# Patient Record
Sex: Female | Born: 1970 | Race: White | Hispanic: No | Marital: Married | Smoking: Never smoker
Health system: Southern US, Community
[De-identification: ages and names within clinical notes are randomized; demographics above are authoritative.]

## PROBLEM LIST (undated history)

## (undated) DIAGNOSIS — D352 Benign neoplasm of pituitary gland: Secondary | ICD-10-CM

## (undated) DIAGNOSIS — T7840XA Allergy, unspecified, initial encounter: Secondary | ICD-10-CM

## (undated) DIAGNOSIS — E349 Endocrine disorder, unspecified: Secondary | ICD-10-CM

## (undated) DIAGNOSIS — N946 Dysmenorrhea, unspecified: Secondary | ICD-10-CM

## (undated) DIAGNOSIS — N809 Endometriosis, unspecified: Secondary | ICD-10-CM

## (undated) DIAGNOSIS — N912 Amenorrhea, unspecified: Secondary | ICD-10-CM

## (undated) DIAGNOSIS — I639 Cerebral infarction, unspecified: Secondary | ICD-10-CM

## (undated) HISTORY — DX: Endometriosis, unspecified: N80.9

## (undated) HISTORY — DX: Cerebral infarction, unspecified: I63.9

## (undated) HISTORY — DX: Benign neoplasm of pituitary gland: D35.2

## (undated) HISTORY — DX: Allergy, unspecified, initial encounter: T78.40XA

## (undated) HISTORY — PX: ABDOMINAL HYSTERECTOMY: SHX81

## (undated) HISTORY — DX: Endocrine disorder, unspecified: E34.9

## (undated) HISTORY — DX: Dysmenorrhea, unspecified: N94.6

## (undated) HISTORY — PX: ENDOMETRIAL ABLATION: SHX621

## (undated) HISTORY — DX: Amenorrhea, unspecified: N91.2

## (undated) HISTORY — PX: COLPOSCOPY: SHX161

## (undated) HISTORY — PX: PELVIC LAPAROSCOPY: SHX162

---

## 2009-03-03 HISTORY — PX: TONSILLECTOMY AND ADENOIDECTOMY: SUR1326

## 2009-12-01 DIAGNOSIS — I639 Cerebral infarction, unspecified: Secondary | ICD-10-CM

## 2009-12-01 HISTORY — DX: Cerebral infarction, unspecified: I63.9

## 2010-11-02 HISTORY — PX: KNEE ARTHROSCOPY W/ ACL RECONSTRUCTION: SHX1858

## 2016-12-04 DIAGNOSIS — J45998 Other asthma: Secondary | ICD-10-CM | POA: Diagnosis not present

## 2016-12-04 DIAGNOSIS — Z8673 Personal history of transient ischemic attack (TIA), and cerebral infarction without residual deficits: Secondary | ICD-10-CM | POA: Diagnosis not present

## 2016-12-04 DIAGNOSIS — E236 Other disorders of pituitary gland: Secondary | ICD-10-CM | POA: Diagnosis not present

## 2016-12-04 DIAGNOSIS — J309 Allergic rhinitis, unspecified: Secondary | ICD-10-CM | POA: Diagnosis not present

## 2016-12-08 ENCOUNTER — Other Ambulatory Visit: Payer: Self-pay | Admitting: Family Medicine

## 2016-12-08 DIAGNOSIS — E236 Other disorders of pituitary gland: Secondary | ICD-10-CM

## 2016-12-23 ENCOUNTER — Ambulatory Visit
Admission: RE | Admit: 2016-12-23 | Discharge: 2016-12-23 | Disposition: A | Discharge status: Home / Self Care | Payer: 59 | Source: Ambulatory Visit | Attending: Family Medicine | Admitting: Family Medicine

## 2016-12-23 DIAGNOSIS — E236 Other disorders of pituitary gland: Secondary | ICD-10-CM | POA: Diagnosis not present

## 2017-01-02 DIAGNOSIS — N643 Galactorrhea not associated with childbirth: Secondary | ICD-10-CM | POA: Diagnosis not present

## 2017-01-02 DIAGNOSIS — H47099 Other disorders of optic nerve, not elsewhere classified, unspecified eye: Secondary | ICD-10-CM | POA: Diagnosis not present

## 2017-01-02 DIAGNOSIS — E221 Hyperprolactinemia: Secondary | ICD-10-CM | POA: Diagnosis not present

## 2017-01-02 DIAGNOSIS — Z8673 Personal history of transient ischemic attack (TIA), and cerebral infarction without residual deficits: Secondary | ICD-10-CM | POA: Diagnosis not present

## 2017-01-15 DIAGNOSIS — E236 Other disorders of pituitary gland: Secondary | ICD-10-CM | POA: Diagnosis not present

## 2017-03-19 MED FILL — CABERGOLINE 0.5 MG TABLET: 0.5 | 28 days supply | Qty: 4 | Fill #0

## 2017-04-14 MED FILL — CABERGOLINE 0.5 MG TABS: 0.5 | 28 days supply | Qty: 4 | Fill #1

## 2017-05-13 MED FILL — CABERGOLINE 0.5 MG TABS: 0.5 | 28 days supply | Qty: 4 | Fill #2

## 2017-06-30 MED FILL — CABERGOLINE 0.5 MG TABS: 0.5 | 28 days supply | Qty: 4 | Fill #3

## 2017-07-08 ENCOUNTER — Other Ambulatory Visit: Payer: Self-pay

## 2017-07-08 ENCOUNTER — Ambulatory Visit (INDEPENDENT_AMBULATORY_CARE_PROVIDER_SITE_OTHER): Payer: No Typology Code available for payment source

## 2017-07-08 ENCOUNTER — Ambulatory Visit (INDEPENDENT_AMBULATORY_CARE_PROVIDER_SITE_OTHER): Payer: No Typology Code available for payment source | Admitting: Family Medicine

## 2017-07-08 ENCOUNTER — Encounter: Payer: Self-pay | Admitting: Family Medicine

## 2017-07-08 VITALS — BP 108/72 | HR 88 | Temp 98.5°F | Ht 61.5 in | Wt 146.2 lb

## 2017-07-08 DIAGNOSIS — M25542 Pain in joints of left hand: Secondary | ICD-10-CM | POA: Diagnosis not present

## 2017-07-08 DIAGNOSIS — Z13220 Encounter for screening for lipoid disorders: Secondary | ICD-10-CM | POA: Diagnosis not present

## 2017-07-08 DIAGNOSIS — D497 Neoplasm of unspecified behavior of endocrine glands and other parts of nervous system: Secondary | ICD-10-CM | POA: Insufficient documentation

## 2017-07-08 DIAGNOSIS — K130 Diseases of lips: Secondary | ICD-10-CM | POA: Diagnosis not present

## 2017-07-08 DIAGNOSIS — R253 Fasciculation: Secondary | ICD-10-CM

## 2017-07-08 DIAGNOSIS — Z8673 Personal history of transient ischemic attack (TIA), and cerebral infarction without residual deficits: Secondary | ICD-10-CM | POA: Insufficient documentation

## 2017-07-08 MED ORDER — DICLOFENAC SODIUM 1 % TD GEL: 2.0000 g | Freq: Four times a day (QID) | TRANSDERMAL | 3 refills | Status: DC

## 2017-07-08 NOTE — Progress Notes (Signed)
Patient: Morgan Wolf MRN: [id number] DOB: Jun 03, 1970 PCP: Orma Flaming, MD     Subjective:  Chief Complaint  Patient presents with  . New Patient (Initial Visit)    hand pain    HPI: The patient is a 47 y.o. female who presents today for left hand issues. She had issues that started years ago. She saw a rheumatologist a few years and work up was negative. It has bothered her ever since, but has started to really flair up this past month or two. Pain is in her left thumb joint. She states if she is doing something that is stressing it, it will "lock up" on her. Pain is constant, but worse when she is using it. Pain is on the muscle and extends into wrist joint. She has not taken any medication otc to see if this alleviates it. Pain rated as a 7-8/10 and described as stabbing in nature. Otherwise it is a dull ache. No trauma to her hand in the past. She is right handed. 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 She does feel like it is weaker than the other hand, but it is her non dominant hand.   She also has a fasciculation in her middle finger that makes her middle finger twitch and you can see the fasciculation up into her wrist. It will last for 2-3 minutes. This started a few weeks ago. This is also in the left hand. She has done nothing out of the ordinary. Denies dropping anything or numbness/tingling.   Chapped lips: she has had excessively chapped lips x 2-3 months. She drinks a ton of water and keeps her lips slathered with chap stick. They are cracking and painful. No dry eyes, no dry mouth and feels like makes good saliva. She denies any dry hands/dry skin. She states she just had her thyroid checked with her endocrinologist.  She did start cabergoline in January, but denies any benadryl/anti histamine use. No other over the counter medication tried.   Review of Systems  Constitutional: Negative for fatigue and fever.  HENT:       Chapped lips   Eyes: Negative for pain.       No  dry eyes  Respiratory: Negative for cough and shortness of breath.   Cardiovascular: Negative for chest pain, palpitations and leg swelling.  Gastrointestinal: Negative for abdominal pain.  Endocrine: Negative for cold intolerance and heat intolerance.  Musculoskeletal: Positive for arthralgias and myalgias. Negative for back pain, gait problem, joint swelling, neck pain and neck stiffness.  Skin: Negative for color change.  Neurological: Negative for tremors, weakness and numbness.       Fasciculations of left middle finger    Allergies Patient is allergic to codeine and sulfa antibiotics.  Past Medical History Patient  has a past medical history of Allergy and Stroke ([place]).  Surgical History Patient  has a past surgical history that includes Tonsillectomy and adenoidectomy (2011); Knee arthroscopy w/ ACL reconstruction (11/2010); and Cesarean section.  Family History Pateint's family history includes Arthritis in her mother; Cancer in her maternal grandfather and paternal grandmother; Diabetes in her paternal grandmother; Hyperlipidemia in her father; Hypertension in her mother.  Social History Patient  reports that she has never smoked. She has never used smokeless tobacco. She reports that she does not use drugs.    Objective: Vitals:   07/08/17 1414  BP: 108/72  Pulse: 88  Temp: 98.5 F (36.9 C)  SpO2: 98%  Weight: 146 lb 3.2 oz (66.3  kg)  Height: 5' 1.5" (1.562 m)    Body mass index is 27.18 kg/m.  Physical Exam  Constitutional: She is oriented to person, place, and time. She appears well-developed and well-nourished.  HENT:  Lips chapped. Not cracked. Normal oral mucosa.   Eyes: Pupils are equal, round, and reactive to light. Conjunctivae and EOM are normal.  Neck: Normal range of motion. Neck supple. No thyromegaly present.  Cardiovascular: Normal rate, regular rhythm, normal heart sounds and intact distal pulses.  No murmur heard. Pulmonary/Chest: Effort  normal and breath sounds normal.  Abdominal: Soft. Bowel sounds are normal. She exhibits no distension. There is no tenderness.  Musculoskeletal: Normal range of motion. She exhibits tenderness. She exhibits no edema.  TTP over left thumb MCP joint. Negative Finkelstein.   Lymphadenopathy:    She has no cervical adenopathy.  Neurological: She is alert and oriented to person, place, and time. She displays normal reflexes. No cranial nerve deficit. Coordination normal.  No muscle wasting in thenar eminence. hand grip intact and strong 5/5. Muscle strength 5/5 in upper extremities.  Negative phalen and tinel sign.   Skin: Skin is warm and dry. No rash noted.  Psychiatric: She has a normal mood and affect. Her behavior is normal.  Vitals reviewed.  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 Official read pending.     Assessment/plan:  1. Arthralgia of left hand Likely secondary to osteoarthritis.  Check labs to rule out any autoimmune disorder reassured her likely osteoarthritis.  We will start her on Voltaren gel up to 4 times a day as needed.  Would prefer she use gel over oral NSAID due to being on high-dose daily aspirin daily would like to reduce risk of ulcer. She is also going to start tumeric.  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  She has no desire to see orthopedics at this time for possible injections and would prefer to continue conservative treatment at this time.  She will let me know if pain becomes worse or unbearable and then we will send to Ortho at that time. - CBC with Differential/Platelet - Comprehensive metabolic panel - ANA - Rheumatoid factor - XR Hand Complete Left - DG Hand Complete Left; Future  2. Muscle twitch Exam reassuring.  She has no muscle atrophy, no muscle weakness, and no loss of handgrip strength.  She has no radicular signs including no numbness or  tingling. This has only been going on for a few weeks so we will check labs and monitor.  She is to let me know if symptoms worsen or she starts to have numbness tingling or loss of strength so we can initiate a neuro referral. - CK  3. Dry lips Likely secondary to new medicine, cabergoline, for her pituitary tumor, but we will check labs to rule out autoimmune disorders such as Sjorgen's syndrome.  Advised Vaseline or Cetaphil moisturizing cream multiple times throughout the day.  She already drinks a significant amount of water. Cool mist humidifier at night.  Thyroid level just checked at her endocrinologist and she does not want to repeat this again. We are requesting records for this and OV notes.   4. Lipid screening Due for recheck. On ASA. No longer on statin due to unknown nature of stroke and thought not secondary to plaque.  - Lipid panel    Return if symptoms worsen  or fail to improve.     Orma Flaming, MD [place]  07/08/2017

## 2017-07-09 LAB — COMPREHENSIVE METABOLIC PANEL
ALT: 10 U/L (ref 0–35)
AST: 14 U/L (ref 0–37)
Albumin: 4.2 g/dL (ref 3.5–5.2)
Alkaline Phosphatase: 54 U/L (ref 39–117)
BUN: 11 mg/dL (ref 6–23)
CO2: 28 mEq/L (ref 19–32)
Calcium: 9.7 mg/dL (ref 8.4–10.5)
Chloride: 103 mEq/L (ref 96–112)
Creatinine, Ser: 0.82 mg/dL (ref 0.40–1.20)
GFR: 80.26 mL/min (ref >60.00)
Glucose, Bld: 90 mg/dL (ref 70–99)
Potassium: 4 mEq/L (ref 3.5–5.1)
Sodium: 139 mEq/L (ref 135–145)
Total Bilirubin: 0.4 mg/dL (ref 0.2–1.2)
Total Protein: 6.7 g/dL (ref 6.0–8.3)

## 2017-07-09 LAB — CBC WITH DIFFERENTIAL/PLATELET
Basophils Absolute: 0 10*3/uL (ref 0.0–0.1)
Basophils Relative: 0.6 % (ref 0.0–3.0)
Eosinophils Absolute: 0.1 10*3/uL (ref 0.0–0.7)
Eosinophils Relative: 1.6 % (ref 0.0–5.0)
HCT: 43 % (ref 36.0–46.0)
Hemoglobin: 14.2 g/dL (ref 12.0–15.0)
Lymphocytes Relative: 27.1 % (ref 12.0–46.0)
Lymphs Abs: 1.7 10*3/uL (ref 0.7–4.0)
MCHC: 32.9 g/dL (ref 30.0–36.0)
MCV: 88.3 fl (ref 78.0–100.0)
Monocytes Absolute: 0.3 10*3/uL (ref 0.1–1.0)
Monocytes Relative: 5.5 % (ref 3.0–12.0)
Neutro Abs: 4.1 10*3/uL (ref 1.4–7.7)
Neutrophils Relative %: 65.2 % (ref 43.0–77.0)
Platelets: 260 10*3/uL (ref 150.0–400.0)
RBC: 4.87 Mil/uL (ref 3.87–5.11)
RDW: 13.1 % (ref 11.5–15.5)
WBC: 6.4 10*3/uL (ref 4.0–10.5)

## 2017-07-09 LAB — LIPID PANEL
Cholesterol: 212 mg/dL — ABNORMAL HIGH (ref 0–200)
HDL: 59.2 mg/dL (ref >39.00)
LDL Cholesterol: 132 mg/dL — ABNORMAL HIGH (ref 0–99)
NonHDL: 152.91
Total CHOL/HDL Ratio: 4
Triglycerides: 104 mg/dL (ref 0.0–149.0)
VLDL: 20.8 mg/dL (ref 0.0–40.0)

## 2017-07-09 LAB — CK: Total CK: 47 U/L (ref 7–177)

## 2017-07-10 LAB — RHEUMATOID FACTOR: Rhuematoid fact SerPl-aCnc: <14 IU/mL (ref <14)

## 2017-07-10 LAB — ANA: Anti Nuclear Antibody(ANA): NEGATIVE

## 2017-07-21 ENCOUNTER — Other Ambulatory Visit (HOSPITAL_COMMUNITY)
Admission: RE | Admit: 2017-07-21 | Discharge: 2017-07-21 | Disposition: A | Discharge status: Home / Self Care | Payer: No Typology Code available for payment source | Source: Ambulatory Visit | Attending: Obstetrics and Gynecology | Admitting: Obstetrics and Gynecology

## 2017-07-21 ENCOUNTER — Ambulatory Visit (INDEPENDENT_AMBULATORY_CARE_PROVIDER_SITE_OTHER): Payer: No Typology Code available for payment source | Admitting: Obstetrics and Gynecology

## 2017-07-21 ENCOUNTER — Other Ambulatory Visit: Payer: Self-pay

## 2017-07-21 ENCOUNTER — Encounter: Payer: Self-pay | Admitting: Obstetrics and Gynecology

## 2017-07-21 VITALS — BP 102/60 | HR 80 | Resp 14 | Ht 61.25 in | Wt 146.0 lb

## 2017-07-21 DIAGNOSIS — Z01419 Encounter for gynecological examination (general) (routine) without abnormal findings: Secondary | ICD-10-CM

## 2017-07-21 DIAGNOSIS — N946 Dysmenorrhea, unspecified: Secondary | ICD-10-CM

## 2017-07-21 DIAGNOSIS — D352 Benign neoplasm of pituitary gland: Secondary | ICD-10-CM | POA: Diagnosis not present

## 2017-07-21 DIAGNOSIS — N914 Secondary oligomenorrhea: Secondary | ICD-10-CM | POA: Diagnosis not present

## 2017-07-21 DIAGNOSIS — Z124 Encounter for screening for malignant neoplasm of cervix: Secondary | ICD-10-CM | POA: Diagnosis present

## 2017-07-21 DIAGNOSIS — Z9889 Other specified postprocedural states: Secondary | ICD-10-CM | POA: Diagnosis not present

## 2017-07-21 NOTE — Patient Instructions (Signed)
EXERCISE AND DIET:  We recommended that you start or continue a regular exercise program for good health. Regular exercise means any activity that makes your heart beat faster and makes you sweat.  We recommend exercising at least 30 minutes per day at least 3 days a week, preferably 4 or 5.  We also recommend a diet low in fat and sugar.  Inactivity, poor dietary choices and obesity can cause diabetes, heart attack, stroke, and kidney damage, among others.    ALCOHOL AND SMOKING:  Women should limit their alcohol intake to no more than 7 drinks/beers/glasses of wine (combined, not each!) per week. Moderation of alcohol intake to this level decreases your risk of breast cancer and liver damage. And of course, no recreational drugs are part of a healthy lifestyle.  And absolutely no smoking or even second hand smoke. Most people know smoking can cause heart and lung diseases, but did you know it also contributes to weakening of your bones? Aging of your skin?  Yellowing of your teeth and nails?  CALCIUM AND VITAMIN D:  Adequate intake of calcium and Vitamin D are recommended.  The recommendations for exact amounts of these supplements seem to change often, but generally speaking 600 mg of calcium (either carbonate or citrate) and 800 units of Vitamin D per day seems prudent. Certain women may benefit from higher intake of Vitamin D.  If you are among these women, your doctor will have told you during your visit.    PAP SMEARS:  Pap smears, to check for cervical cancer or precancers,  have traditionally been done yearly, although recent scientific advances have shown that most women can have pap smears less often.  However, every woman still should have a physical exam from her gynecologist every year. It will include a breast check, inspection of the vulva and vagina to check for abnormal growths or skin changes, a visual exam of the cervix, and then an exam to evaluate the size and shape of the uterus and  ovaries.  And after 45 years of age, a rectal exam is indicated to check for rectal cancers. We will also provide age appropriate advice regarding health maintenance, like when you should have certain vaccines, screening for sexually transmitted diseases, bone density testing, colonoscopy, mammograms, etc.   MAMMOGRAMS:  All women over 40 years old should have a yearly mammogram. Many facilities now offer a "3D" mammogram, which may cost around $50 extra out of pocket. If possible,  we recommend you accept the option to have the 3D mammogram performed.  It both reduces the number of women who will be called back for extra views which then turn out to be normal, and it is better than the routine mammogram at detecting truly abnormal areas.    COLONOSCOPY:  Colonoscopy to screen for colon cancer is recommended for all women at age 50.  We know, you hate the idea of the prep.  We agree, BUT, having colon cancer and not knowing it is worse!!  Colon cancer so often starts as a polyp that can be seen and removed at colonscopy, which can quite literally save your life!  And if your first colonoscopy is normal and you have no family history of colon cancer, most women don't have to have it again for 10 years.  Once every ten years, you can do something that may end up saving your life, right?  We will be happy to help you get it scheduled when you are ready.    Be sure to check your insurance coverage so you understand how much it will cost.  It may be covered as a preventative service at no cost, but you should check your particular policy.      Breast Self-Awareness Breast self-awareness means being familiar with how your breasts look and feel. It involves checking your breasts regularly and reporting any changes to your health care provider. Practicing breast self-awareness is important. A change in your breasts can be a sign of a serious medical problem. Being familiar with how your breasts look and feel allows  you to find any problems early, when treatment is more likely to be successful. All women should practice breast self-awareness, including women who have had breast implants. How to do a breast self-exam One way to learn what is normal for your breasts and whether your breasts are changing is to do a breast self-exam. To do a breast self-exam: Look for Changes  1. Remove all the clothing above your waist. 2. Stand in front of a mirror in a room with good lighting. 3. Put your hands on your hips. 4. Push your hands firmly downward. 5. Compare your breasts in the mirror. Look for differences between them (asymmetry), such as: ? Differences in shape. ? Differences in size. ? Puckers, dips, and bumps in one breast and not the other. 6. Look at each breast for changes in your skin, such as: ? Redness. ? Scaly areas. 7. Look for changes in your nipples, such as: ? Discharge. ? Bleeding. ? Dimpling. ? Redness. ? A change in position. Feel for Changes  Carefully feel your breasts for lumps and changes. It is best to do this while lying on your back on the floor and again while sitting or standing in the shower or tub with soapy water on your skin. Feel each breast in the following way:  Place the arm on the side of the breast you are examining above your head.  Feel your breast with the other hand.  Start in the nipple area and make  inch (2 cm) overlapping circles to feel your breast. Use the pads of your three middle fingers to do this. Apply light pressure, then medium pressure, then firm pressure. The light pressure will allow you to feel the tissue closest to the skin. The medium pressure will allow you to feel the tissue that is a little deeper. The firm pressure will allow you to feel the tissue close to the ribs.  Continue the overlapping circles, moving downward over the breast until you feel your ribs below your breast.  Move one finger-width toward the center of the body.  Continue to use the  inch (2 cm) overlapping circles to feel your breast as you move slowly up toward your collarbone.  Continue the up and down exam using all three pressures until you reach your armpit.  Write Down What You Find  Write down what is normal for each breast and any changes that you find. Keep a written record with breast changes or normal findings for each breast. By writing this information down, you do not need to depend only on memory for size, tenderness, or location. Write down where you are in your menstrual cycle, if you are still menstruating. If you are having trouble noticing differences in your breasts, do not get discouraged. With time you will become more familiar with the variations in your breasts and more comfortable with the exam. How often should I examine my breasts? Examine  your breasts every month. If you are breastfeeding, the best time to examine your breasts is after a feeding or after using a breast pump. If you menstruate, the best time to examine your breasts is 5-7 days after your period is over. During your period, your breasts are lumpier, and it may be more difficult to notice changes. When should I see my health care provider? See your health care provider if you notice:  A change in shape or size of your breasts or nipples.  A change in the skin of your breast or nipples, such as a reddened or scaly area.  Unusual discharge from your nipples.  A lump or thick area that was not there before.  Pain in your breasts.  Anything that concerns you.  This information is not intended to replace advice given to you by your health care provider. Make sure you discuss any questions you have with your health care provider. Document Released: 02/17/2005 Document Revised: 07/26/2015 Document Reviewed: 01/07/2015 Elsevier Interactive Patient Education  [employer].

## 2017-07-21 NOTE — Progress Notes (Signed)
47 y.o. [id number] MarriedCaucasianF here for annual exam.   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 Diagnosed in 2011 when she had a stroke. No residual issues from the stroke. Se was on OCP's at the time, was ill. Not clear why she has had a stroke.  She had an endometrial ablation in 2012, cycles were monthly and very heavy. She having very irregular bleeding since the ablation. Only bleeds or spots for 1-3 every 4-6 months. She has had severe menstrual like cramping every 3-6 month, often doesn't bleed with the cramps. Can last 1-2 days, takes Aleve which helps.  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 Some vaginal dryness.  Period Duration (Days): 1-2 days  Period Pattern: (!) Irregular Menstrual Flow: Light, Moderate Menstrual Control: Thin pad Menstrual Control Change Freq (Hours): changes pad 2-3 times a day  Dysmenorrhea: (!) Severe Dysmenorrhea Symptoms: Cramping  Sexually active, no pain.   Patient's last menstrual period was 05/26/2017.          Sexually active: Yes.    The current method of family planning is vasectomy.    Exercising: Yes.    aerobic/ dance/ weights/ walk  Smoker:  no  Health Maintenance: Pap:  02/2016 WNL per patient  History of abnormal Pap:  Yes - years ago- colposcopy neg MMG:  02/2016 WNL per patient  Colonoscopy: 2002 normal per patient  BMD:   02/2016 normal per patient  TDaP:  2011 Gardasil: no, declines.     reports that she has never smoked. She has never used smokeless tobacco. She reports that she drinks about 1.2 - 1.8 oz of alcohol per week. She reports that she does not use drugs. 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 Husband works for IT at [employer].  Past Medical History:  Diagnosis Date  . Allergy   . Amenorrhea   . Dysmenorrhea   . Endometriosis   . Hormone disorder   . Prolactinoma ([place])    Followed by Dr Chalmers Cater  . Stroke [facility])     Past Surgical History:  Procedure Laterality Date   . CESAREAN SECTION    . COLPOSCOPY    . ENDOMETRIAL ABLATION    . KNEE ARTHROSCOPY W/ ACL RECONSTRUCTION  11/2010  . PELVIC LAPAROSCOPY     Endometriosis  . TONSILLECTOMY AND ADENOIDECTOMY  2011    Current Outpatient Medications  Medication Sig Dispense Refill  . aspirin 325 MG tablet Take 325 mg by mouth daily.    . cabergoline (DOSTINEX) 0.5 MG tablet Take 0.25 mg by mouth once a week.    . diclofenac sodium (VOLTAREN) 1 % GEL Apply 2 g topically 4 (four) times daily. To lefy thumb joint (Patient not taking: Reported on 07/21/2017) 100 g 3   No current facility-administered medications for this visit.     Family History  Problem Relation Age of Onset  . Arthritis Mother   . Hypertension Mother   . Hyperlipidemia Father   . Cancer Maternal Grandfather   . Lung cancer Maternal Grandfather   . Cancer Paternal Grandmother   . Diabetes Paternal Grandmother   . Breast cancer Paternal Grandmother     Review of Systems  Constitutional: Negative.   HENT: Negative.   Eyes: Negative.   Respiratory: Negative.   Cardiovascular: Negative.   Gastrointestinal: Negative.   Endocrine: Negative.   Genitourinary: Positive for menstrual problem.       Dysmenorrhea  Irregular menstrual cycles  Musculoskeletal: Negative.   Skin: Negative.   Allergic/Immunologic: Negative.   Neurological: Negative.   Psychiatric/Behavioral: Negative.     Exam:   BP 102/60 (BP Location: Right Arm, Patient Position: Sitting, Cuff Size: Normal)   Pulse 80   Resp 14   Ht 5' 1.25" (1.556 m)   Wt 146 lb (66.2 kg)   LMP 05/26/2017   BMI 27.36 kg/m   Weight change: @WEIGHTCHANGE @ Height:   Height: 5' 1.25" (155.6 cm)  Ht Readings from Last 3 Encounters:  07/21/17 5' 1.25" (1.556 m)  07/08/17 5' 1.5" (1.562 m)    General appearance: alert, cooperative and appears stated age Head: Normocephalic, without obvious abnormality, atraumatic Neck: no adenopathy, supple, symmetrical, trachea midline and  thyroid normal to inspection and palpation Lungs: clear to auscultation bilaterally Cardiovascular: regular rate and rhythm Breasts: normal appearance, no masses or tenderness Abdomen: soft, non-tender; non distended,  no masses,  no organomegaly Extremities: extremities normal, atraumatic, no cyanosis or edema Skin: Skin color, texture, turgor normal. No rashes or lesions Lymph nodes: Cervical, supraclavicular, and axillary nodes normal. No abnormal inguinal nodes palpated Neurologic: Grossly normal   Pelvic: External genitalia:  no lesions              Urethra:  normal appearing urethra with no masses, tenderness or lesions              Bartholins and Skenes: normal                 Vagina: mildly atrophic appearing vagina with normal color and discharge, no lesions              Cervix: no lesions               Bimanual Exam:  Uterus:  normal size, contour, position, consistency, mobility, non-tender and anteverted              Adnexa: no mass, fullness, tenderness               Rectovaginal: Confirms               Anus:  normal sphincter tone, no lesions  Chaperone was present for exam.  A:  Well Woman with normal exam  H/O endometrial ablation  Oligomenorrhea, prolactin levels and TSH with Dr Chalmers Cater  Severe dysmenorrhea  P:   Pap with hpv  Mammogram # given  Labs UTD with her primary  Get a copy of her labs from Dr Chalmers Cater  Return for an ultrasound (discussed sonohysterogram and endometrial biopsy, she had a hard time with prior biopsy)  If she needs further evaluation will discuss options.

## 2017-07-22 LAB — ESTRADIOL: Estradiol: 113.5 pg/mL

## 2017-07-22 LAB — FOLLICLE STIMULATING HORMONE: FSH: 2.6 m[IU]/mL

## 2017-07-23 LAB — CYTOLOGY - PAP
Diagnosis: NEGATIVE
HPV: NOT DETECTED

## 2017-07-28 ENCOUNTER — Other Ambulatory Visit: Payer: No Typology Code available for payment source | Admitting: Obstetrics and Gynecology

## 2017-07-28 ENCOUNTER — Ambulatory Visit (INDEPENDENT_AMBULATORY_CARE_PROVIDER_SITE_OTHER): Payer: No Typology Code available for payment source | Admitting: Obstetrics and Gynecology

## 2017-07-28 ENCOUNTER — Encounter: Payer: Self-pay | Admitting: Obstetrics and Gynecology

## 2017-07-28 ENCOUNTER — Ambulatory Visit (INDEPENDENT_AMBULATORY_CARE_PROVIDER_SITE_OTHER): Payer: No Typology Code available for payment source

## 2017-07-28 VITALS — BP 110/60 | HR 70 | Ht 61.25 in | Wt 146.6 lb

## 2017-07-28 DIAGNOSIS — N939 Abnormal uterine and vaginal bleeding, unspecified: Secondary | ICD-10-CM

## 2017-07-28 DIAGNOSIS — N946 Dysmenorrhea, unspecified: Secondary | ICD-10-CM | POA: Diagnosis not present

## 2017-07-28 DIAGNOSIS — Z9889 Other specified postprocedural states: Secondary | ICD-10-CM

## 2017-07-28 DIAGNOSIS — R102 Pelvic and perineal pain: Secondary | ICD-10-CM | POA: Diagnosis not present

## 2017-07-28 DIAGNOSIS — N914 Secondary oligomenorrhea: Secondary | ICD-10-CM

## 2017-07-28 NOTE — Progress Notes (Signed)
GYNECOLOGY  VISIT   HPI: 47 y.o.   Married  Caucasian  female   G2P2002 with Patient's last menstrual period was 07/25/2017 (exact date).here for pelvic ultrasound. She has a h/o an endometrial ablation in 2012. She spots or bleeds for 1-3 days every 4-6 months and has intermittent severe menstrual like cramping every 3-6 months. The cramping and spotting don't typically come together.    GYNECOLOGIC HISTORY: Patient's last menstrual period was 07/25/2017 (exact date). Contraception:vasectomy Menopausal hormone therapy: none        OB History    Gravida  2   Para  2   Term  2   Preterm      AB      Living  2     SAB      TAB      Ectopic      Multiple      Live Births  2              Patient Active Problem List   Diagnosis Date Noted  . 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 History of CVA (cerebrovascular accident) 07/08/2017    Past Medical History:  Diagnosis Date  . Allergy   . Amenorrhea   . Dysmenorrhea   . Endometriosis   . Hormone disorder   . Prolactinoma ([place])    Followed by Dr Chalmers Cater  . Stroke [facility])     Past Surgical History:  Procedure Laterality Date  . CESAREAN SECTION    . COLPOSCOPY    . ENDOMETRIAL ABLATION    . KNEE ARTHROSCOPY W/ ACL RECONSTRUCTION  11/2010  . PELVIC LAPAROSCOPY     Endometriosis  . TONSILLECTOMY AND ADENOIDECTOMY  2011  C/S x 2. Laparoscopy was prior to C/S. No mention of significant adhesions  Current Outpatient Medications  Medication Sig Dispense Refill  . aspirin 325 MG tablet Take 325 mg by mouth daily.    . cabergoline (DOSTINEX) 0.5 MG tablet Take 0.25 mg by mouth once a week.    . diclofenac sodium (VOLTAREN) 1 % GEL Apply 2 g topically 4 (four) times daily. To lefy thumb joint 100 g 3   No current facility-administered medications for this visit.      ALLERGIES: Codeine and Sulfa antibiotics  Family History  Problem Relation Age of Onset  . Arthritis Mother   . Hypertension Mother   .  Hyperlipidemia Father   . Cancer Maternal Grandfather   . Lung cancer Maternal Grandfather   . Cancer Paternal Grandmother   . Diabetes Paternal Grandmother   . Breast cancer Paternal Grandmother     Social History   Socioeconomic History  . Marital status: Married    Spouse name: Katherina Wimer  . Number of children: 2  . Years of education: Not on file  . Highest education level: Not on file  Occupational History  . Not on file  Social Needs  . Financial resource strain: Not on file  . Food insecurity:    Worry: Not on file    Inability: Not on file  . Transportation needs:    Medical: Not on file    Non-medical: Not on file  Tobacco Use  . Smoking status: Never Smoker  . Smokeless tobacco: Never Used  Substance and Sexual Activity  . Alcohol use: Yes    Alcohol/week: 1.2 - 1.8 oz    Types: 2 - 3 Standard drinks or equivalent per week  . Drug use: Never  . Sexual activity: Yes  Partners: Male    Birth control/protection: Other-see comments    Comment: Vasectomy   Lifestyle  . Physical activity:    Days per week: Not on file    Minutes per session: Not on file  . Stress: Not on file  Relationships  . Social connections:    Talks on phone: Not on file    Gets together: Not on file    Attends religious service: Not on file    Active member of club or organization: Not on file    Attends meetings of clubs or organizations: Not on file    Relationship status: Not on file  . Intimate partner violence:    Fear of current or ex partner: Not on file    Emotionally abused: Not on file    Physically abused: Not on file    Forced sexual activity: Not on file  Other Topics Concern  . Not on file  Social History Narrative  . Not on file    Review of Systems  Constitutional: Negative.   HENT: Negative.   Eyes: Negative.   Respiratory: Negative.   Cardiovascular: Negative.   Gastrointestinal: Negative.   Genitourinary: Negative.   Musculoskeletal: Negative.    Skin: Negative.   Neurological: Negative.   Endo/Heme/Allergies: Negative.   Psychiatric/Behavioral: Negative.     PHYSICAL EXAMINATION:    BP 110/60   Pulse 70   Ht 5' 1.25" (1.556 m)   Wt 146 lb 9.6 oz (66.5 kg)   LMP 07/25/2017 (Exact Date)   BMI 27.47 kg/m     General appearance: alert, cooperative and appears stated age  Ultrasound images reviewed with the patient. U/S c/w adenomyosis, endometrium not clearly seen/distorted.   ASSESSMENT Abnormal uterine spotting and intermittent pelvic pain s/p endometrial ablation. She appears to have adenomyosis. Unable to adequately evaluate the lining of the uterus on ultrasound. Patient unable to tolerate endometrial biopsy in the past.  Not a candidate for OCP's secondary to a history of stroke.     PLAN Discussed options of doing nothing (small risk of missing a malignancy), hysteroscopy, D&C under ultrasound guidance, TLH/BS She is leaning toward TLH/BS We discussed the risks of surgery and the recovery. She will discuss it further with her husband.    An After Visit Summary was printed and given to the patient.  ~15 minutes face to face time of which over 50% was spent in counseling.

## 2017-07-29 ENCOUNTER — Telehealth: Payer: Self-pay | Admitting: Obstetrics and Gynecology

## 2017-07-29 NOTE — Telephone Encounter (Signed)
Call placed to patient to review benefits for potential surgery, as reviewed with Dr Talbert Nan. Left voicemail message requesting a return call

## 2017-07-29 NOTE — Telephone Encounter (Signed)
Patient returned call. Spoke with patient regarding benefit for potential surgery for a total laparoscopic hysterectomy and bilateral salpingectomy.. Patient understood and agreeable. Patient aware this is professional benefit only. Patient aware will be contacted by hospital for separate benefits. Patient is also aware of cancellation policy for surgery. Patient advises she is ready to proceed with scheduling and requested to advise Dr Talbert Nan.  Routing to Dr Talbert Nan  cc: Lamont Snowball, RN

## 2017-07-29 NOTE — Telephone Encounter (Signed)
Call to patient. Per DPR, can leave message on voice mail, which has number confirmation.  Left message regarding surgery date options and dates available in June and July. Left message to call back.

## 2017-07-30 NOTE — Telephone Encounter (Signed)
Patient returning call to [facility].

## 2017-07-30 NOTE — Telephone Encounter (Signed)
Call to patient. Surgery confirmed for 08-03-17 at [facility] at 0730. Surgery instruction sheet reviewed and printed copy will be provided to patient at consult appointment on 08-03-17.   Routing to provider for final review. Patient agreeable to disposition. Will close encounter.

## 2017-07-30 NOTE — Telephone Encounter (Signed)
Return call to patient. Desires to proceed with 08-10-17 surgery date. Advised will schedule and call her back once confirmed.

## 2017-08-03 ENCOUNTER — Telehealth: Payer: Self-pay | Admitting: Obstetrics and Gynecology

## 2017-08-03 ENCOUNTER — Ambulatory Visit (INDEPENDENT_AMBULATORY_CARE_PROVIDER_SITE_OTHER): Payer: No Typology Code available for payment source | Admitting: Obstetrics and Gynecology

## 2017-08-03 ENCOUNTER — Other Ambulatory Visit: Payer: Self-pay

## 2017-08-03 ENCOUNTER — Encounter: Payer: Self-pay | Admitting: Obstetrics and Gynecology

## 2017-08-03 VITALS — BP 124/70 | HR 84 | Resp 14

## 2017-08-03 DIAGNOSIS — Z9889 Other specified postprocedural states: Secondary | ICD-10-CM

## 2017-08-03 DIAGNOSIS — R102 Pelvic and perineal pain: Secondary | ICD-10-CM

## 2017-08-03 DIAGNOSIS — N939 Abnormal uterine and vaginal bleeding, unspecified: Secondary | ICD-10-CM

## 2017-08-03 DIAGNOSIS — Z8742 Personal history of other diseases of the female genital tract: Secondary | ICD-10-CM

## 2017-08-03 NOTE — Progress Notes (Addendum)
47 y.o. [id number] MarriedCaucasianF here for a pre-operative visit.  The patient has irregular bleeding and intermittent severe pelvic pain s/p endometrial ablation in 2012. Ultrasound c/w adenomyosis, endometrium not clearly seen/distorted. She has been unable to tolerate an office endometrial biopsy in the past, u/s is concerning for uterine scar tissue. She is not a candidate for OCP's given her h/o a stroke.   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 It was diagnosed in 2011 when she had a stroke. She was on OCP's at the time of the stroke and was ill with food poisoning and a URI (violently vomiting). Etiology of the stroke is not clear. No residual deficits. 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 Chronic infarcts on the left.   She reports her last prolactin level was in the 20's on medication    Patient's last menstrual period was 07/25/2017 (exact date).          Sexually active: Yes.    The current method of family planning is vasectomy.    Exercising: Yes.     Smoker:  no  Health Maintenance: Pap:  07/21/17 negative, negative hpv History of abnormal Pap:  Yes, years ago, negative colposcopy   reports that she has never smoked. She has never used smokeless tobacco. She reports that she drinks about 1.2 - 1.8 oz of alcohol per week. She reports that she does not use drugs. 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 Husband works for IT at [employer].   Past Medical History:  Diagnosis Date  . Allergy   . Amenorrhea   . Dysmenorrhea   . Endometriosis   . Hormone disorder   . Prolactinoma ([place])    Followed by Dr Chalmers Cater  . Stroke [facility])     Past Surgical History:  Procedure Laterality Date  . CESAREAN SECTION    . COLPOSCOPY    . ENDOMETRIAL ABLATION    . KNEE ARTHROSCOPY W/ ACL RECONSTRUCTION  11/2010  . PELVIC LAPAROSCOPY     Endometriosis  . TONSILLECTOMY AND ADENOIDECTOMY  2011  C/S x 2  Current Outpatient Medications   Medication Sig Dispense Refill  . aspirin 325 MG tablet Take 325 mg by mouth daily.    . cabergoline (DOSTINEX) 0.5 MG tablet Take 0.25 mg by mouth once a week. Monday    . Multiple Vitamins-Minerals (MULTIVITAMIN WITH MINERALS) tablet Take 1 tablet by mouth daily.    . Turmeric 500 MG TABS Take 1 capsule by mouth daily. With Glucosamine     No current facility-administered medications for this visit.     Family History  Problem Relation Age of Onset  . Arthritis Mother   . Hypertension Mother   . Hyperlipidemia Father   . Cancer Maternal Grandfather   . Lung cancer Maternal Grandfather   . Cancer Paternal Grandmother   . Diabetes Paternal Grandmother   . Breast cancer Paternal Grandmother     Review of Systems  Constitutional: Negative.   HENT: Negative.   Eyes: Negative.   Respiratory: Negative.   Cardiovascular: Negative.   Gastrointestinal: Negative.   Endocrine: Negative.   Genitourinary: Negative.   Musculoskeletal: Negative.   Skin: Negative.   Allergic/Immunologic: Negative.   Neurological: Negative.   Psychiatric/Behavioral: Negative.     Exam:   LMP 07/25/2017 (Exact Date)   Weight change: @WEIGHTCHANGE @ Height:      Ht Readings from Last 3 Encounters:  07/28/17 5' 1.25" (  1.556 m)  07/21/17 5' 1.25" (1.556 m)  07/08/17 5' 1.5" (1.562 m)    General appearance: alert, cooperative and appears stated age Head: Normocephalic, without obvious abnormality, atraumatic Heart: regular rate and rhythm Lungs: CTAB Abdomen: soft, non-tender; bowel sounds normal; no masses,  no organomegaly Extremities: normal, atraumatic, no cyanosis Skin: normal color, texture and turgor, no rashes or lesions Lymph: normal cervical supraclavicular and inguinal nodes Neurologic: grossly normal    A:  Pelvic pain  Irregular bleeding  H/O endometrial ablation, unable to adequately evaluate her cavity  H/O Stroke, not a candidate for OCP's  H/O macroadenoma controled on  Cabergoline  P:   Discussed options, including do nothing (aware of risk of missing cancer), hysteroscopy under ultrasound guidance, and laparoscopic hysterectomy, bilateral salpingectomy.  She desires definitive surgery  Discussed total laparoscopic hysterectomy, salpingectomy, treatment of endometriosis and cystoscopy. Reviewed the risks of the procedure, including infection, bleeding, damage to bowel/badder/vessels/ureters.  Discussed the possible need for laparotomy. Discussed post operative recovery and risk of cuff dehiscence. All of her questions were answered   Will stop her ASA today  Lovenox pre-op  Called patient's Neurosurgeon, Dr Kathyrn Sheriff and left a message for him to call to discuss her care  CC: Dr Alinda Sierras, Dr Chalmers Cater Dr Rogers Blocker

## 2017-08-03 NOTE — Telephone Encounter (Signed)
[place] scheduled for 08-10-17.  Routing to Dr Talbert Nan.

## 2017-08-03 NOTE — Patient Instructions (Addendum)
Morgan Wolf  08/03/2017      Your procedure is scheduled on  Monday 08/10/2017   Report to [name]  at   [place].M.   Call this number if you have problems the morning of surgery:[phone]   OUR ADDRESS IS [address], WE ARE LOCATED IN THE MEDICAL PLAZA WITH [facility].    Remember:  Do not eat food or drink liquids after midnight.   Take these medicines the morning of surgery with A SIP OF WATER:  none   Do not wear jewelry, make-up or nail polish.  Do not wear lotions, powders, or perfumes, or deoderant.  Do not shave 48 hours prior to surgery.  Men may shave face and neck.  Do not bring valuables to the hospital.  [name] is not responsible for any belongings or valuables.  Contacts, dentures or bridgework may not be worn into surgery.  Leave your suitcase in the car.  After surgery it may be brought to your room.  For patients admitted to the hospital, discharge time will be determined by your treatment team.  Patients discharged the day of surgery will not be allowed to drive home.   Special instructions:  Please read over the following fact sheets that you were given:  Please bring your medications in original bottles with you to the hospital the morning of surgery!     [name] - Preparing for Surgery Before surgery, you can play an important role.  Because skin is not sterile, your skin needs to be as free of germs as possible.  You can reduce the number of germs on your skin by washing with CHG (chlorahexidine gluconate) soap before surgery.  CHG is an antiseptic cleaner which kills germs and bonds with the skin to continue killing germs even after washing. Please DO NOT use if you have an allergy to CHG or antibacterial soaps.  If your skin becomes reddened/irritated stop using the CHG and inform your nurse when you arrive at Short Stay. Do not shave (including legs and underarms) for at least 48 hours prior to the  first CHG shower.  You may shave your face/neck. Please follow these instructions carefully:  1.  Shower with CHG Soap the night before surgery and the  morning of Surgery.  2.  If you choose to wash your hair, wash your hair first as usual with your  normal  shampoo.  3.  After you shampoo, rinse your hair and body thoroughly to remove the  shampoo.                           4.  Use CHG as you would any other liquid soap.  You can apply chg directly  to the skin and wash                       Gently with a scrungie or clean washcloth.  5.  Apply the CHG Soap to your body ONLY FROM THE NECK DOWN.   Do not use on face/ open                           Wound or open sores. Avoid contact with eyes, ears mouth and genitals (private parts).  Wash face,  Genitals (private parts) with your normal soap.             6.  Wash thoroughly, paying special attention to the area where your surgery  will be performed.  7.  Thoroughly rinse your body with warm water from the neck down.  8.  DO NOT shower/wash with your normal soap after using and rinsing off  the CHG Soap.                9.  Pat yourself dry with a clean towel.            10.  Wear clean pajamas.            11.  Place clean sheets on your bed the night of your first shower and do not  sleep with pets. Day of Surgery : Do not apply any lotions/deodorants the morning of surgery.  Please wear clean clothes to the hospital/surgery center.  FAILURE TO FOLLOW THESE INSTRUCTIONS MAY RESULT IN THE CANCELLATION OF YOUR SURGERY PATIENT SIGNATURE_________________________________  NURSE SIGNATURE__________________________________  ________________________________________________________________________

## 2017-08-03 NOTE — Telephone Encounter (Signed)
Morgan Wolf called requesting that orders be put in Epic for surgery.

## 2017-08-03 NOTE — Telephone Encounter (Signed)
Orders entered by Dr Talbert Nan. Encounter closed.

## 2017-08-03 NOTE — H&P (Signed)
47 y.o. [id number] MarriedCaucasianF here for a pre-operative visit.  The patient has irregular bleeding and intermittent severe pelvic pain s/p endometrial ablation in 2012. Ultrasound c/w adenomyosis, endometrium not clearly seen/distorted. She has been unable to tolerate an office endometrial biopsy in the past, u/s is concerning for uterine scar tissue. She is not a candidate for OCP's given her h/o a stroke.   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 It was diagnosed in 2011 when she had a stroke. She was on OCP's at the time of the stroke and was ill with food poisoning and a URI (violently vomiting). Etiology of the stroke is not clear. No residual deficits. 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 Chronic infarcts on the left.   She reports her last prolactin level was in the 20's on medication    Patient's last menstrual period was 07/25/2017 (exact date).          Sexually active: Yes.    The current method of family planning is vasectomy.    Exercising: Yes.     Smoker:  no  Health Maintenance: Pap:  07/21/17 negative, negative hpv History of abnormal Pap:  Yes, years ago, negative colposcopy   reports that she has never smoked. She has never used smokeless tobacco. She reports that she drinks about 1.2 - 1.8 oz of alcohol per week. She reports that she does not use drugs. 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 Husband works for IT at [employer].   Past Medical History:  Diagnosis Date  . Allergy   . Amenorrhea   . Dysmenorrhea   . Endometriosis   . Hormone disorder   . Prolactinoma ([place])    Followed by Dr Chalmers Cater  . Stroke [facility])     Past Surgical History:  Procedure Laterality Date  . CESAREAN SECTION    . COLPOSCOPY    . ENDOMETRIAL ABLATION    . KNEE ARTHROSCOPY W/ ACL RECONSTRUCTION  11/2010  . PELVIC LAPAROSCOPY     Endometriosis  . TONSILLECTOMY AND ADENOIDECTOMY  2011  C/S x 2  Current Outpatient  Medications  Medication Sig Dispense Refill  . aspirin 325 MG tablet Take 325 mg by mouth daily.    . cabergoline (DOSTINEX) 0.5 MG tablet Take 0.25 mg by mouth once a week. Monday    . Multiple Vitamins-Minerals (MULTIVITAMIN WITH MINERALS) tablet Take 1 tablet by mouth daily.    . Turmeric 500 MG TABS Take 1 capsule by mouth daily. With Glucosamine     No current facility-administered medications for this visit.     Family History  Problem Relation Age of Onset  . Arthritis Mother   . Hypertension Mother   . Hyperlipidemia Father   . Cancer Maternal Grandfather   . Lung cancer Maternal Grandfather   . Cancer Paternal Grandmother   . Diabetes Paternal Grandmother   . Breast cancer Paternal Grandmother     Review of Systems  Constitutional: Negative.   HENT: Negative.   Eyes: Negative.   Respiratory: Negative.   Cardiovascular: Negative.   Gastrointestinal: Negative.   Endocrine: Negative.   Genitourinary: Negative.   Musculoskeletal: Negative.   Skin: Negative.   Allergic/Immunologic: Negative.   Neurological: Negative.   Psychiatric/Behavioral: Negative.     Exam:   LMP 07/25/2017 (Exact Date)   Weight change: @WEIGHTCHANGE @ Height:      Ht Readings from Last 3 Encounters:  07/28/17 5' 1.25" (  1.556 m)  07/21/17 5' 1.25" (1.556 m)  07/08/17 5' 1.5" (1.562 m)    General appearance: alert, cooperative and appears stated age Head: Normocephalic, without obvious abnormality, atraumatic Heart: regular rate and rhythm Lungs: CTAB Abdomen: soft, non-tender; bowel sounds normal; no masses,  no organomegaly Extremities: normal, atraumatic, no cyanosis Skin: normal color, texture and turgor, no rashes or lesions Lymph: normal cervical supraclavicular and inguinal nodes Neurologic: grossly normal    A:  Pelvic pain  Irregular bleeding  H/O endometrial ablation, unable to adequately evaluate her cavity  H/O Stroke, not a candidate for OCP's  H/O macroadenoma  controled on Cabergoline  P:   Discussed options, including do nothing (aware of risk of missing cancer), hysteroscopy under ultrasound guidance, and laparoscopic hysterectomy, bilateral salpingectomy.  She desires definitive surgery  Discussed total laparoscopic hysterectomy, salpingectomy, treatment of endometriosis and cystoscopy. Reviewed the risks of the procedure, including infection, bleeding, damage to bowel/badder/vessels/ureters.  Discussed the possible need for laparotomy. Discussed post operative recovery and risk of cuff dehiscence. All of her questions were answered   Will stop her ASA today  Lovenox pre-op  Called patient's Neurosurgeon, Dr Kathyrn Sheriff and left a message for him to call to discuss her care  CC: Dr Alinda Sierras, Dr Chalmers Cater

## 2017-08-04 ENCOUNTER — Encounter (HOSPITAL_COMMUNITY)
Admission: RE | Admit: 2017-08-04 | Discharge: 2017-08-04 | Disposition: A | Discharge status: Home / Self Care | Payer: No Typology Code available for payment source | Source: Ambulatory Visit | Attending: Obstetrics and Gynecology | Admitting: Obstetrics and Gynecology

## 2017-08-04 ENCOUNTER — Encounter (HOSPITAL_COMMUNITY): Payer: Self-pay

## 2017-08-04 ENCOUNTER — Other Ambulatory Visit: Payer: Self-pay

## 2017-08-04 DIAGNOSIS — Z01818 Encounter for other preprocedural examination: Secondary | ICD-10-CM | POA: Diagnosis present

## 2017-08-04 DIAGNOSIS — N939 Abnormal uterine and vaginal bleeding, unspecified: Secondary | ICD-10-CM | POA: Diagnosis not present

## 2017-08-04 DIAGNOSIS — Z8742 Personal history of other diseases of the female genital tract: Secondary | ICD-10-CM | POA: Insufficient documentation

## 2017-08-04 DIAGNOSIS — R102 Pelvic and perineal pain: Secondary | ICD-10-CM | POA: Insufficient documentation

## 2017-08-04 LAB — COMPREHENSIVE METABOLIC PANEL
ALT: 14 U/L (ref 14–54)
AST: 20 U/L (ref 15–41)
Albumin: 4.1 g/dL (ref 3.5–5.0)
Alkaline Phosphatase: 56 U/L (ref 38–126)
Anion gap: 5 (ref 5–15)
BUN: 10 mg/dL (ref 6–20)
CO2: 27 mmol/L (ref 22–32)
Calcium: 9 mg/dL (ref 8.9–10.3)
Chloride: 108 mmol/L (ref 101–111)
Creatinine, Ser: 0.64 mg/dL (ref 0.44–1.00)
GFR calc Af Amer: >60 mL/min (ref >60)
GFR calc non Af Amer: >60 mL/min (ref >60)
Glucose, Bld: 91 mg/dL (ref 65–99)
Potassium: 4.2 mmol/L (ref 3.5–5.1)
Sodium: 140 mmol/L (ref 135–145)
Total Bilirubin: 0.9 mg/dL (ref 0.3–1.2)
Total Protein: 6.8 g/dL (ref 6.5–8.1)

## 2017-08-04 LAB — CBC
HCT: 42.7 % (ref 36.0–46.0)
Hemoglobin: 13.8 g/dL (ref 12.0–15.0)
MCH: 28.9 pg (ref 26.0–34.0)
MCHC: 32.3 g/dL (ref 30.0–36.0)
MCV: 89.5 fL (ref 78.0–100.0)
Platelets: 231 10*3/uL (ref 150–400)
RBC: 4.77 MIL/uL (ref 3.87–5.11)
RDW: 13.2 % (ref 11.5–15.5)
WBC: 3.9 10*3/uL — ABNORMAL LOW (ref 4.0–10.5)

## 2017-08-04 LAB — HCG, SERUM, QUALITATIVE: Preg, Serum: NEGATIVE

## 2017-08-04 MED FILL — CABERGOLINE 0.5 MG TABS: 0.5 | 28 days supply | Qty: 4 | Fill #4

## 2017-08-04 NOTE — Progress Notes (Signed)
Spoke to Dr. Montez Hageman, [facility] via phone and reviewed medical history with him and  about seeing patient for consult for Dr. Talbert Nan, Per Dr. Montez Hageman, patient will be seen and assessed day of surgery.

## 2017-08-09 NOTE — Anesthesia Preprocedure Evaluation (Addendum)
Anesthesia Evaluation  Patient identified by MRN, date of birth, ID band Patient awake    Reviewed: Allergy & Precautions, NPO status , Patient's Chart, lab work & pertinent test results  Airway Mallampati: I  TM Distance: >3 FB Neck ROM: Full    Dental  (+) Dental Advisory Given, Teeth Intact   Pulmonary neg pulmonary ROS,    breath sounds clear to auscultation       Cardiovascular negative cardio ROS   Rhythm:Regular Rate:Normal     Neuro/Psych Prolactinoma CVA, No Residual Symptoms negative psych ROS   GI/Hepatic negative GI ROS, Neg liver ROS,   Endo/Other  negative endocrine ROS  Renal/GU negative Renal ROS  negative genitourinary   Musculoskeletal negative musculoskeletal ROS (+)   Abdominal   Peds  Hematology negative hematology ROS (+)   Anesthesia Other Findings   Reproductive/Obstetrics Dysmenorrhea/amenorrhea, endometriosis                            Anesthesia Physical Anesthesia Plan  ASA: II  Anesthesia Plan: General   Post-op Pain Management:    Induction: Intravenous  PONV Risk Score and Plan: 4 or greater and Treatment may vary due to age or medical condition, Ondansetron, Dexamethasone, Midazolam and Scopolamine patch - Pre-op  Airway Management Planned: Oral ETT  Additional Equipment: None  Intra-op Plan:   Post-operative Plan: Extubation in OR  Informed Consent: I have reviewed the patients History and Physical, chart, labs and discussed the procedure including the risks, benefits and alternatives for the proposed anesthesia with the patient or authorized representative who has indicated his/her understanding and acceptance.   Dental advisory given  Plan Discussed with: CRNA and Anesthesiologist  Anesthesia Plan Comments:         Anesthesia Quick Evaluation

## 2017-08-10 ENCOUNTER — Ambulatory Visit (HOSPITAL_BASED_OUTPATIENT_CLINIC_OR_DEPARTMENT_OTHER)
Admission: RE | Admit: 2017-08-10 | Discharge: 2017-08-10 | Disposition: A | Discharge status: Home / Self Care | Payer: No Typology Code available for payment source | Source: Ambulatory Visit | Attending: Obstetrics and Gynecology | Admitting: Obstetrics and Gynecology

## 2017-08-10 ENCOUNTER — Other Ambulatory Visit: Payer: Self-pay

## 2017-08-10 ENCOUNTER — Ambulatory Visit (HOSPITAL_BASED_OUTPATIENT_CLINIC_OR_DEPARTMENT_OTHER): Payer: No Typology Code available for payment source | Admitting: Anesthesiology

## 2017-08-10 ENCOUNTER — Encounter (HOSPITAL_BASED_OUTPATIENT_CLINIC_OR_DEPARTMENT_OTHER)
Admission: RE | Disposition: A | Discharge status: Home / Self Care | Payer: Self-pay | Source: Ambulatory Visit | Attending: Obstetrics and Gynecology

## 2017-08-10 ENCOUNTER — Encounter (HOSPITAL_BASED_OUTPATIENT_CLINIC_OR_DEPARTMENT_OTHER): Payer: Self-pay | Admitting: *Deleted

## 2017-08-10 ENCOUNTER — Encounter: Payer: Self-pay | Admitting: Obstetrics and Gynecology

## 2017-08-10 DIAGNOSIS — K660 Peritoneal adhesions (postprocedural) (postinfection): Secondary | ICD-10-CM | POA: Insufficient documentation

## 2017-08-10 DIAGNOSIS — Z79899 Other long term (current) drug therapy: Secondary | ICD-10-CM | POA: Diagnosis not present

## 2017-08-10 DIAGNOSIS — N939 Abnormal uterine and vaginal bleeding, unspecified: Secondary | ICD-10-CM | POA: Diagnosis not present

## 2017-08-10 DIAGNOSIS — Z9889 Other specified postprocedural states: Secondary | ICD-10-CM | POA: Diagnosis not present

## 2017-08-10 DIAGNOSIS — Z7982 Long term (current) use of aspirin: Secondary | ICD-10-CM | POA: Insufficient documentation

## 2017-08-10 DIAGNOSIS — Z8673 Personal history of transient ischemic attack (TIA), and cerebral infarction without residual deficits: Secondary | ICD-10-CM | POA: Diagnosis not present

## 2017-08-10 DIAGNOSIS — N800 Endometriosis of uterus: Secondary | ICD-10-CM | POA: Insufficient documentation

## 2017-08-10 DIAGNOSIS — N838 Other noninflammatory disorders of ovary, fallopian tube and broad ligament: Secondary | ICD-10-CM | POA: Insufficient documentation

## 2017-08-10 DIAGNOSIS — Z90710 Acquired absence of both cervix and uterus: Secondary | ICD-10-CM | POA: Diagnosis present

## 2017-08-10 DIAGNOSIS — R102 Pelvic and perineal pain: Secondary | ICD-10-CM | POA: Insufficient documentation

## 2017-08-10 HISTORY — PX: TOTAL LAPAROSCOPIC HYSTERECTOMY WITH SALPINGECTOMY: SHX6742

## 2017-08-10 HISTORY — PX: CYSTOSCOPY: SHX5120

## 2017-08-10 SURGERY — HYSTERECTOMY, TOTAL, LAPAROSCOPIC, WITH SALPINGECTOMY
Anesthesia: General

## 2017-08-10 MED ORDER — ROCURONIUM BROMIDE 10 MG/ML (PF) SYRINGE
PREFILLED_SYRINGE | INTRAVENOUS | Status: AC
  Filled 2017-08-10: qty 5

## 2017-08-10 MED ORDER — BUPIVACAINE HCL (PF) 0.25 % IJ SOLN
INTRAMUSCULAR | Status: DC | PRN
  Administered 2017-08-10: 6 mL

## 2017-08-10 MED ORDER — DOCUSATE SODIUM 100 MG PO CAPS
100.0000 mg | ORAL_CAPSULE | Freq: Two times a day (BID) | ORAL | Status: DC
  Administered 2017-08-10: 100 mg via ORAL
  Filled 2017-08-10: qty 1

## 2017-08-10 MED ORDER — ONDANSETRON HCL 4 MG/2ML IJ SOLN
4.0000 mg | Freq: Four times a day (QID) | INTRAMUSCULAR | Status: DC | PRN
  Filled 2017-08-10: qty 2

## 2017-08-10 MED ORDER — OXYCODONE HCL 5 MG PO TABS
5.0000 mg | ORAL_TABLET | Freq: Once | ORAL | Status: DC | PRN
  Filled 2017-08-10: qty 1

## 2017-08-10 MED ORDER — FENTANYL CITRATE (PF) 250 MCG/5ML IJ SOLN
INTRAMUSCULAR | Status: AC
  Filled 2017-08-10: qty 5

## 2017-08-10 MED ORDER — PROPOFOL 10 MG/ML IV BOLUS
INTRAVENOUS | Status: DC | PRN
  Administered 2017-08-10: 30 mg via INTRAVENOUS
  Administered 2017-08-10: 150 mg via INTRAVENOUS

## 2017-08-10 MED ORDER — KETOROLAC TROMETHAMINE 30 MG/ML IJ SOLN
30.0000 mg | Freq: Four times a day (QID) | INTRAMUSCULAR | Status: DC
  Filled 2017-08-10: qty 1

## 2017-08-10 MED ORDER — KCL IN DEXTROSE-NACL 20-5-0.45 MEQ/L-%-% IV SOLN
INTRAVENOUS | Status: DC
  Administered 2017-08-10: 11:00:00 via INTRAVENOUS
  Filled 2017-08-10 (×4): qty 1000

## 2017-08-10 MED ORDER — MENTHOL 3 MG MT LOZG
1.0000 | LOZENGE | OROMUCOSAL | Status: DC | PRN
  Filled 2017-08-10: qty 9

## 2017-08-10 MED ORDER — ENOXAPARIN SODIUM 40 MG/0.4ML ~~LOC~~ SOLN
40.0000 mg | SUBCUTANEOUS | Status: AC
  Administered 2017-08-10: 40 mg via SUBCUTANEOUS
  Filled 2017-08-10: qty 0.4

## 2017-08-10 MED ORDER — FENTANYL CITRATE (PF) 100 MCG/2ML IJ SOLN
INTRAMUSCULAR | Status: DC | PRN
  Administered 2017-08-10 (×5): 50 ug via INTRAVENOUS

## 2017-08-10 MED ORDER — MIDAZOLAM HCL 5 MG/5ML IJ SOLN
INTRAMUSCULAR | Status: DC | PRN
  Administered 2017-08-10: 2 mg via INTRAVENOUS

## 2017-08-10 MED ORDER — OXYCODONE-ACETAMINOPHEN 5-325 MG PO TABS
2.0000 | ORAL_TABLET | ORAL | Status: DC | PRN
  Filled 2017-08-10: qty 2

## 2017-08-10 MED ORDER — SCOPOLAMINE 1 MG/3DAYS TD PT72
MEDICATED_PATCH | TRANSDERMAL | Status: DC | PRN
  Administered 2017-08-10: 1 via TRANSDERMAL

## 2017-08-10 MED ORDER — HYDROMORPHONE HCL 1 MG/ML IJ SOLN
0.2000 mg | INTRAMUSCULAR | Status: DC | PRN
  Filled 2017-08-10: qty 1

## 2017-08-10 MED ORDER — CEFOTETAN DISODIUM 2 G IJ SOLR
2.0000 g | INTRAMUSCULAR | Status: AC
  Administered 2017-08-10: 2 g via INTRAVENOUS
  Filled 2017-08-10: qty 2

## 2017-08-10 MED ORDER — TRAMADOL HCL 50 MG PO TABS
50.0000 mg | ORAL_TABLET | Freq: Four times a day (QID) | ORAL | Status: DC | PRN
  Administered 2017-08-10: 50 mg via ORAL
  Filled 2017-08-10: qty 1

## 2017-08-10 MED ORDER — LIDOCAINE 2% (20 MG/ML) 5 ML SYRINGE
INTRAMUSCULAR | Status: DC | PRN
Start: 2017-08-10 — End: 2017-08-10
  Administered 2017-08-10: 60 mg via INTRAVENOUS

## 2017-08-10 MED ORDER — ONDANSETRON HCL 4 MG/2ML IJ SOLN
INTRAMUSCULAR | Status: AC
  Filled 2017-08-10: qty 4

## 2017-08-10 MED ORDER — TRAMADOL HCL 50 MG PO TABS
ORAL_TABLET | ORAL | Status: AC
  Filled 2017-08-10: qty 1

## 2017-08-10 MED ORDER — HYDROMORPHONE HCL 1 MG/ML IJ SOLN
INTRAMUSCULAR | Status: DC | PRN
  Administered 2017-08-10: 0.5 mg via INTRAVENOUS

## 2017-08-10 MED ORDER — ZOLPIDEM TARTRATE 5 MG PO TABS
5.0000 mg | ORAL_TABLET | Freq: Every evening | ORAL | Status: DC | PRN
  Filled 2017-08-10: qty 1

## 2017-08-10 MED ORDER — LIDOCAINE 2% (20 MG/ML) 5 ML SYRINGE
INTRAMUSCULAR | Status: AC
  Filled 2017-08-10: qty 5

## 2017-08-10 MED ORDER — ALUM & MAG HYDROXIDE-SIMETH 200-200-20 MG/5ML PO SUSP
30.0000 mL | ORAL | Status: DC | PRN
  Administered 2017-08-10: 30 mL via ORAL
  Filled 2017-08-10 (×2): qty 30

## 2017-08-10 MED ORDER — MIDAZOLAM HCL 2 MG/2ML IJ SOLN
INTRAMUSCULAR | Status: AC
  Filled 2017-08-10: qty 2

## 2017-08-10 MED ORDER — OXYCODONE HCL 5 MG/5ML PO SOLN
5.0000 mg | Freq: Once | ORAL | Status: DC | PRN
  Filled 2017-08-10: qty 5

## 2017-08-10 MED ORDER — KETOROLAC TROMETHAMINE 30 MG/ML IJ SOLN
INTRAMUSCULAR | Status: AC
  Filled 2017-08-10: qty 1

## 2017-08-10 MED ORDER — KETOROLAC TROMETHAMINE 30 MG/ML IJ SOLN
30.0000 mg | Freq: Four times a day (QID) | INTRAMUSCULAR | Status: DC
  Administered 2017-08-10: 30 mg via INTRAVENOUS
  Filled 2017-08-10: qty 1

## 2017-08-10 MED ORDER — FENTANYL CITRATE (PF) 100 MCG/2ML IJ SOLN
INTRAMUSCULAR | Status: AC
  Filled 2017-08-10: qty 2

## 2017-08-10 MED ORDER — DEXAMETHASONE SODIUM PHOSPHATE 10 MG/ML IJ SOLN
INTRAMUSCULAR | Status: DC | PRN
  Administered 2017-08-10: 10 mg via INTRAVENOUS

## 2017-08-10 MED ORDER — LACTATED RINGERS IV SOLN
INTRAVENOUS | Status: DC
  Administered 2017-08-10 (×2): via INTRAVENOUS
  Filled 2017-08-10: qty 1000

## 2017-08-10 MED ORDER — SCOPOLAMINE 1 MG/3DAYS TD PT72
MEDICATED_PATCH | TRANSDERMAL | Status: AC
  Filled 2017-08-10: qty 1

## 2017-08-10 MED ORDER — TRAMADOL HCL 50 MG PO TABS: 50.0000 mg | ORAL_TABLET | Freq: Four times a day (QID) | ORAL | 0 refills | Status: DC | PRN

## 2017-08-10 MED ORDER — KETOROLAC TROMETHAMINE 30 MG/ML IJ SOLN
INTRAMUSCULAR | Status: DC | PRN
  Administered 2017-08-10: 30 mg via INTRAVENOUS

## 2017-08-10 MED ORDER — DOCUSATE SODIUM 100 MG PO CAPS
ORAL_CAPSULE | ORAL | Status: AC
  Filled 2017-08-10: qty 1

## 2017-08-10 MED ORDER — ONDANSETRON HCL 4 MG PO TABS
4.0000 mg | ORAL_TABLET | Freq: Four times a day (QID) | ORAL | Status: DC | PRN
  Filled 2017-08-10: qty 1

## 2017-08-10 MED ORDER — ONDANSETRON HCL 4 MG/2ML IJ SOLN
INTRAMUSCULAR | Status: DC | PRN
  Administered 2017-08-10 (×2): 4 mg via INTRAVENOUS

## 2017-08-10 MED ORDER — ROCURONIUM BROMIDE 100 MG/10ML IV SOLN
INTRAVENOUS | Status: DC | PRN
Start: 2017-08-10 — End: 2017-08-10
  Administered 2017-08-10 (×2): 20 mg via INTRAVENOUS
  Administered 2017-08-10: 50 mg via INTRAVENOUS

## 2017-08-10 MED ORDER — SUGAMMADEX SODIUM 200 MG/2ML IV SOLN
INTRAVENOUS | Status: AC
  Filled 2017-08-10: qty 2

## 2017-08-10 MED ORDER — ENOXAPARIN SODIUM 40 MG/0.4ML ~~LOC~~ SOLN
SUBCUTANEOUS | Status: AC
  Filled 2017-08-10: qty 0.4

## 2017-08-10 MED ORDER — PROMETHAZINE HCL 25 MG/ML IJ SOLN
6.2500 mg | INTRAMUSCULAR | Status: DC | PRN
  Filled 2017-08-10: qty 1

## 2017-08-10 MED ORDER — ENOXAPARIN SODIUM 40 MG/0.4ML ~~LOC~~ SOLN
40.0000 mg | SUBCUTANEOUS | Status: DC
  Filled 2017-08-10: qty 0.4

## 2017-08-10 MED ORDER — NAPROXEN SODIUM 550 MG PO TABS: 550.0000 mg | ORAL_TABLET | Freq: Two times a day (BID) | ORAL | 2 refills | Status: DC

## 2017-08-10 MED ORDER — SODIUM CHLORIDE 0.9 % IV SOLN
INTRAVENOUS | Status: AC
  Filled 2017-08-10: qty 2

## 2017-08-10 MED ORDER — SUGAMMADEX SODIUM 200 MG/2ML IV SOLN
INTRAVENOUS | Status: DC | PRN
  Administered 2017-08-10: 200 mg via INTRAVENOUS

## 2017-08-10 MED ORDER — HYDROMORPHONE HCL 2 MG/ML IJ SOLN
INTRAMUSCULAR | Status: AC
  Filled 2017-08-10: qty 1

## 2017-08-10 MED ORDER — ACETAMINOPHEN 325 MG PO TABS
650.0000 mg | ORAL_TABLET | ORAL | Status: DC | PRN
  Filled 2017-08-10: qty 2

## 2017-08-10 MED ORDER — PROPOFOL 10 MG/ML IV BOLUS
INTRAVENOUS | Status: AC
  Filled 2017-08-10: qty 20

## 2017-08-10 MED ORDER — FENTANYL CITRATE (PF) 100 MCG/2ML IJ SOLN
25.0000 ug | INTRAMUSCULAR | Status: DC | PRN
  Administered 2017-08-10 (×2): 50 ug via INTRAVENOUS
  Filled 2017-08-10: qty 1

## 2017-08-10 MED ORDER — DEXAMETHASONE SODIUM PHOSPHATE 10 MG/ML IJ SOLN
INTRAMUSCULAR | Status: AC
  Filled 2017-08-10: qty 1

## 2017-08-10 MED FILL — traMADol HCL 50 MG TABS: 50 | 10 days supply | Qty: 30 | Fill #0

## 2017-08-10 MED FILL — NAPROXEN SODIUM 550 MG TAB: 550 | 15 days supply | Qty: 30 | Fill #0

## 2017-08-10 SURGICAL SUPPLY — 66 items
APPLICATOR ARISTA FLEXITIP XL (MISCELLANEOUS) ×3 IMPLANT
BLADE SURG 10 STRL SS (BLADE) IMPLANT
CABLE HIGH FREQUENCY MONO STRZ (ELECTRODE) ×3 IMPLANT
CANISTER SUCT 3000ML PPV (MISCELLANEOUS) ×3 IMPLANT
CELL SAVER LIPIGURD (MISCELLANEOUS) IMPLANT
COVER MAYO STAND STRL (DRAPES) ×3 IMPLANT
COVER TABLE BACK 60X90 (DRAPES) ×3 IMPLANT
DECANTER SPIKE VIAL GLASS SM (MISCELLANEOUS) ×6 IMPLANT
DERMABOND ADVANCED (GAUZE/BANDAGES/DRESSINGS) ×4
DERMABOND ADVANCED .7 DNX12 (GAUZE/BANDAGES/DRESSINGS) ×8 IMPLANT
DRSG COVADERM PLUS 2X2 (GAUZE/BANDAGES/DRESSINGS) ×12 IMPLANT
DRSG OPSITE POSTOP 3X4 (GAUZE/BANDAGES/DRESSINGS) IMPLANT
DURAPREP 26ML APPLICATOR (WOUND CARE) ×3 IMPLANT
EXTRT SYSTEM ALEXIS 14CM (MISCELLANEOUS)
EXTRT SYSTEM ALEXIS 17CM (MISCELLANEOUS)
GLOVE BIO SURGEON STRL SZ 6.5 (GLOVE) ×6 IMPLANT
GLOVE BIOGEL PI IND STRL 7.0 (GLOVE) ×4 IMPLANT
GLOVE BIOGEL PI INDICATOR 7.0 (GLOVE) ×2
GLOVE INDICATOR 7.5 STRL GRN (GLOVE) ×15 IMPLANT
GOWN STRL REUS W/TWL LRG LVL3 (GOWN DISPOSABLE) ×12 IMPLANT
HARMONIC RUM II 2.5CM SILVER (DISPOSABLE)
HARMONIC RUM II 3.0CM SILVER (DISPOSABLE) ×3
HARMONIC RUM II 3.5CM SILVER (DISPOSABLE)
HARMONIC RUM II 4.0CM SILVER (DISPOSABLE)
HEMOSTAT ARISTA ABSORB 3G PWDR (MISCELLANEOUS) ×3 IMPLANT
IV NS IRRIG 3000ML ARTHROMATIC (IV SOLUTION) ×3 IMPLANT
LIGASURE VESSEL 5MM BLUNT TIP (ELECTROSURGICAL) ×3 IMPLANT
NEEDLE INSUFFLATION 120MM (ENDOMECHANICALS) ×3 IMPLANT
PACK LAPAROSCOPY BASIN (CUSTOM PROCEDURE TRAY) ×3 IMPLANT
PACK TRENDGUARD 450 HYBRID PRO (MISCELLANEOUS) ×2 IMPLANT
POUCH LAPAROSCOPIC INSTRUMENT (MISCELLANEOUS) ×3 IMPLANT
PROTECTOR NERVE ULNAR (MISCELLANEOUS) IMPLANT
RETRACTOR WOUND ALXS 19CM XSML (INSTRUMENTS) IMPLANT
RTRCTR WOUND ALEXIS 19CM XSML (INSTRUMENTS)
SCALPEL HRMNC RUM II 2.5 SILVR (DISPOSABLE) IMPLANT
SCALPEL HRMNC RUM II 3.0 SILVR (DISPOSABLE) ×2 IMPLANT
SCALPEL HRMNC RUM II 3.5 SILVR (DISPOSABLE) IMPLANT
SCALPEL HRMNC RUM II 4.0 SILVR (DISPOSABLE) IMPLANT
SCISSORS LAP 5X35 DISP (ENDOMECHANICALS) IMPLANT
SET CYSTO W/LG BORE CLAMP LF (SET/KITS/TRAYS/PACK) ×3 IMPLANT
SET IRRIG TUBING LAPAROSCOPIC (IRRIGATION / IRRIGATOR) ×3 IMPLANT
SET TRI-LUMEN FLTR TB AIRSEAL (TUBING) ×3 IMPLANT
SHEARS HARMONIC ACE PLUS 36CM (ENDOMECHANICALS) ×3 IMPLANT
SLEEVE SURGEON STRL (DRAPES) ×3 IMPLANT
SUT VIC AB 0 CT1 36 (SUTURE) ×3 IMPLANT
SUT VIC AB 3-0 PS2 18 (SUTURE) ×1
SUT VIC AB 3-0 PS2 18XBRD (SUTURE) ×2 IMPLANT
SUT VICRYL 0 UR6 27IN ABS (SUTURE) ×3 IMPLANT
SUT VICRYL 4-0 PS2 18IN ABS (SUTURE) ×3 IMPLANT
SUT VLOC 180 0 9IN  GS21 (SUTURE) ×1
SUT VLOC 180 0 9IN GS21 (SUTURE) ×2 IMPLANT
SYR 50ML LL SCALE MARK (SYRINGE) ×6 IMPLANT
SYSTEM CONTND EXTRCTN KII BLLN (MISCELLANEOUS) IMPLANT
TIP RUMI ORANGE 6.7MMX12CM (TIP) IMPLANT
TIP UTERINE 5.1X6CM LAV DISP (MISCELLANEOUS) ×3 IMPLANT
TIP UTERINE 6.7X10CM GRN DISP (MISCELLANEOUS) IMPLANT
TIP UTERINE 6.7X6CM WHT DISP (MISCELLANEOUS) IMPLANT
TIP UTERINE 6.7X8CM BLUE DISP (MISCELLANEOUS) IMPLANT
TOWEL OR 17X24 6PK STRL BLUE (TOWEL DISPOSABLE) ×6 IMPLANT
TRAY FOLEY CATH SILVER 14FR (SET/KITS/TRAYS/PACK) ×3 IMPLANT
TRENDGUARD 450 HYBRID PRO PACK (MISCELLANEOUS) ×3
TROCAR ADV FIXATION 5X100MM (TROCAR) ×3 IMPLANT
TROCAR PORT AIRSEAL 5X120 (TROCAR) ×3 IMPLANT
TROCAR XCEL NON BLADE 8MM B8LT (ENDOMECHANICALS) ×3 IMPLANT
TROCAR XCEL NON-BLD 5MMX100MML (ENDOMECHANICALS) ×3 IMPLANT
WARMER LAPAROSCOPE (MISCELLANEOUS) ×6 IMPLANT

## 2017-08-10 NOTE — Anesthesia Procedure Notes (Signed)
Procedure Name: Intubation Date/Time: 08/10/2017 7:30 AM Performed by: Gwyndolyn Saxon, CRNA Pre-anesthesia Checklist: Patient identified, Emergency Drugs available, Suction available, Patient being monitored and Timeout performed Patient Re-evaluated:Patient Re-evaluated prior to induction Oxygen Delivery Method: Circle system utilized Preoxygenation: Pre-oxygenation with 100% oxygen Induction Type: IV induction Ventilation: Mask ventilation without difficulty Laryngoscope Size: Miller and 2 Grade View: Grade II Tube type: Oral Tube size: 7.0 mm Number of attempts: 1 Placement Confirmation: ETT inserted through vocal cords under direct vision,  positive ETCO2,  CO2 detector and breath sounds checked- equal and bilateral Secured at: 21 cm Tube secured with: Tape Dental Injury: Teeth and Oropharynx as per pre-operative assessment

## 2017-08-10 NOTE — Op Note (Signed)
Preoperative Diagnosis: Abnormal uterine bleeding, pelvic pain, history of endometrial ablation  Postoperative Diagnosis: Same  Procedure:  Total Laparoscopic Hysterectomy with bilateral salpingectomy, lysis of adhesions and cystoscopy  Surgeon: Dr Sumner Boast  Assistant: Dr Josefa Half  Anesthesia: General  EBL: 25 cc  Fluids: 1,500 cc LR  Urine output: 767 cc  Complications: none  Indications for surgery: The patient is a 47 year old female, who presented with irregular uterine bleeding, intermittent episodes of severe abdominal pain and a h/o an endometrial biopsy. An ultrasound showed adenomyosis and a distorted, poorly delineated endometrial cavity, pap smear was normal. The patient is not a candidate for OCP's or a mirena IUD and desired definitive treatment.  The patient is aware of the risks and complications involved with the surgery and consent was obtained prior to the procedure.  Findings: EUA: normal sized, mobile uterus, no adnexal masses. Laparoscopy: normal appearing uterus and bilateral adnexa. No active endometriosis was seen. Normal appendix and normal liver edge. There were some adhesions of her omentum to her anterior abdominal wall.   Procedure: The patient was taken to the operating room with an IV in placed, preoperative antibiotics and lovenox had been administered. She was placed in the dorsal lithotomy position. General anesthesia was administered. She was prepped and draped in the usual sterile fashion for an abdominal, vaginal surgery. A rumi uterine manipulator was placed, using a # 3 cup and a 6 cm extender. In order to place the rumi, a false passage was developed in the mid uterus with the hagar dilators. The rumi balloon could only be inflated with 5 cc of saline.  A foley catheter was placed.    The umbilicus was everted, injected with 0.25% marcaine and incised with a # 11 blade. 2 towel clips were used to elevated the umbilicus and a veress needle was  placed into the abdominal cavity. The abdominal cavity was insufflated with CO2, with normal intraabdominal pressures. After adequate pneumo-insufflation the veress needle was removed and the 5 mm laparoscope was placed into the abdominal cavity using the opti-view trocar. The patient was placed in trendelenburg and the abdominal pelvic cavity was inspected. 3 more trocars were placed: 1 in each lower quadrant approximately 3 cm medial to and superior to the anterior superior iliac spine and one in the midline approximately 6 cm above the pubic symphysis in the midline. These areas were injected with 0.25% marcaine, incised with a #11 blade and all trocars were inserted with direct visualization with the laparoscope. A # 5 airseal trocar was placed in the RLQ, a 5 mm trocar in the LLQ and a #8 airseal in the midline. The abdominal pelvic cavity was again inspected. The omental adhesions were taken down with the harmonic scalpel.   The left tube was elevated from the pelvic sidewall, cauterized and cut with the ligasure device. The mesosalpinx was cauterized and cut with the ligasure device. The tube was separated from the uterus using the ligasure device and removed through the midline trocar. The left uteroovarian ligament was cauterized and cut with the ligasure device. The left round ligament was cauterized and cut with the ligasure device and the anterior and posterior leafs of the broad ligament were taken down with the ligasure device. The harmonic scalpel was then used to take down the bladder flap and skeltonize the vessels. The left uterine vessels were then clamped, cauterized and ligated with the ligasure device. Hemostasis was excellent. The same procedure was repeated on the right.  Using the rumi manipulator the uterus was pushed up in the pelvic cavity and the harmonic scalpel was used to separate the cervix from the vagina using the harmonic energy. The uterus was removed vaginally at this time.  An occluder was placed in the vagina to maintain pneumoperitoneum. The vaginal cuff was then closed with a 0 V-lock suture. Hemostasis was excellent. The abdominal pelvic cavity was irrigated and suctioned dry. Pressure was released and hemostasis remained excellent.   The abdominal cavity was desufflated and the trocars were removed. The skin was closed with subcuticular stiches of 4-0 vicryl and dermabond was placed over the incisions.  The foley catheter was removed and cystoscopy was performed using a 70 degree scope. Both ureters expelled urine, no bladder abnormalities were noted. The bladder was allowed to drain and the cystoscope was removed.   The patient's abdomen and perineum were cleansed and she was taken out of the dorsal lithotomy position. Upon awakening she was extubated and taken to the recovery room in stable condition. The sponge and instrument counts were correct.

## 2017-08-10 NOTE — Anesthesia Postprocedure Evaluation (Signed)
Anesthesia Post Note  Patient: Morgan Wolf  Procedure(s) Performed: TOTAL LAPAROSCOPIC HYSTERECTOMY WITH SALPINGECTOMY (Bilateral ) CYSTOSCOPY (N/A )     Patient location during evaluation: PACU Anesthesia Type: General Level of consciousness: awake and alert Pain management: pain level controlled Vital Signs Assessment: post-procedure vital signs reviewed and stable Respiratory status: spontaneous breathing, nonlabored ventilation and respiratory function stable Cardiovascular status: blood pressure returned to baseline and stable Postop Assessment: no apparent nausea or vomiting Anesthetic complications: no    Last Vitals:  Vitals:   08/10/17 1045 08/10/17 1100  BP: 123/67 118/73  Pulse: 71 69  Resp: (!) 21 16  Temp:  36.6 C  SpO2: 98% 99%    Last Pain:  Vitals:   08/10/17 1100  TempSrc:   PainSc: [name]

## 2017-08-10 NOTE — Interval H&P Note (Signed)
History and Physical Interval Note:  08/10/2017 7:12 AM  [name]  has presented today for surgery, with the diagnosis of AUB, pelvic pain, history endometrial ablation  The various methods of treatment have been discussed with the patient and family. After consideration of risks, benefits and other options for treatment, the patient has consented to  Procedure(s) with comments: TOTAL LAPAROSCOPIC HYSTERECTOMY WITH SALPINGECTOMY (Bilateral) - extended recovery bed CYSTOSCOPY  possible (N/A) as a surgical intervention .  The patient's history has been reviewed, patient examined, no change in status, stable for surgery.  I have reviewed the patient's chart and labs.  Questions were answered to the patient's satisfaction.   Consent includes possible treatment of endometriosis   Morgan Wolf

## 2017-08-10 NOTE — Transfer of Care (Signed)
Immediate Anesthesia Transfer of Care Note  Patient: Morgan Wolf  Procedure(s) Performed: TOTAL LAPAROSCOPIC HYSTERECTOMY WITH SALPINGECTOMY (Bilateral ) CYSTOSCOPY (N/A )  Patient Location: PACU  Anesthesia Type:General  Level of Consciousness: drowsy and patient cooperative  Airway & Oxygen Therapy: Patient Spontanous Breathing and Patient connected to nasal cannula oxygen  Post-op Assessment: Report given to RN and Post -op Vital signs reviewed and stable  Post vital signs: Reviewed and stable  Last Vitals:  Vitals Value Taken Time  BP 111/70 08/10/2017  9:45 AM  Temp    Pulse 77 08/10/2017  9:47 AM  Resp 13 08/10/2017  9:47 AM  SpO2 100 % 08/10/2017  9:47 AM  Vitals shown include unvalidated device data.  Last Pain:  Vitals:   08/10/17 0626  TempSrc:   PainSc: 0-No pain      Patients Stated Pain Goal: 6 (13/14/38 8875)  Complications: No apparent anesthesia complications

## 2017-08-10 NOTE — Progress Notes (Signed)
Day of Surgery Procedure(s) (LRB): TOTAL LAPAROSCOPIC HYSTERECTOMY WITH SALPINGECTOMY (Bilateral) CYSTOSCOPY (N/A)  Subjective: Patient reports that she is doing well. Ambulating, tolerating po, voiding well, minimal discomfort, no bleeding  Objective: I have reviewed patient's vital signs and intake and output.  Today's Vitals   08/10/17 1257 08/10/17 1400 08/10/17 1600 08/10/17 1700  BP: (!) 104/59   123/60  Pulse: 67   70  Resp: 16   16  Temp: (!) 97.5 F (36.4 C)   98.4 F (36.9 C)  TempSrc:      SpO2: 96%   100%  Weight:      Height:      PainSc: 4  0-No pain 0-No pain    No intake/output data recorded. Total I/O In: 2805 [P.O.:480; I.V.:2325] Out: 1800 [Urine:1775; Blood:25]     General: alert, cooperative and no distress Resp: clear to auscultation bilaterally Cardio: regular rate and rhythm GI: soft, non-tender; bowel sounds normal; no masses,  no organomegaly and incision: bandages are dry Extremities: extremities normal, atraumatic, no cyanosis or edema Vaginal Bleeding: none  Assessment: s/p Procedure(s) with comments: TOTAL LAPAROSCOPIC HYSTERECTOMY WITH SALPINGECTOMY (Bilateral) - extended recovery bed CYSTOSCOPY (N/A): stable, progressing well and tolerating diet  Plan: Discharge home  LOS: 0 days    Salvadore Dom 08/10/2017, 5:25 PM

## 2017-08-11 ENCOUNTER — Telehealth: Payer: Self-pay | Admitting: Obstetrics and Gynecology

## 2017-08-11 ENCOUNTER — Encounter (HOSPITAL_BASED_OUTPATIENT_CLINIC_OR_DEPARTMENT_OTHER): Payer: Self-pay | Admitting: Obstetrics and Gynecology

## 2017-08-11 NOTE — Telephone Encounter (Signed)
Patient sent the following correspondence through [facility]. Routing to triage to assist patient with request.  ----- Message from [place], Generic sent at 08/10/2017 8:49 PM EDT -----    I flailed to even ask what you found during my surgery! Anything?  Morgan Wolf wondered if anything was sent for biopsy.  Thank you!!

## 2017-08-11 NOTE — Telephone Encounter (Signed)
Call to patient. Message given to patient as seen below from Dr. Talbert Nan and patient verbalized understanding. Patient states that she is feeling good today. She states, "I'm tired, but that is to be expected." Patient also states minimal pain that the "tramadol and anaprox are keeping the pain at bay." RN advised would give Dr. Talbert Nan an update and patient agreeable. Patient appreciative of phone call.   Routing to provider for final review. Patient agreeable to disposition. Will close encounter.

## 2017-08-11 NOTE — Telephone Encounter (Signed)
Please let the patient know that I didn't find any active endometriosis. She did have some adhesions from her prior c/s that were taken down. She could have been getting pain in her uterine cavity from the prior ablation. The uterus and both tubes were sent to pathology.  How is she feeling today?

## 2017-08-11 NOTE — Telephone Encounter (Signed)
Routing to [place] for review. Surgical pathology for tissue is pending.

## 2017-08-17 ENCOUNTER — Ambulatory Visit (INDEPENDENT_AMBULATORY_CARE_PROVIDER_SITE_OTHER): Payer: No Typology Code available for payment source | Admitting: Obstetrics and Gynecology

## 2017-08-17 ENCOUNTER — Other Ambulatory Visit: Payer: Self-pay

## 2017-08-17 ENCOUNTER — Encounter: Payer: Self-pay | Admitting: Obstetrics and Gynecology

## 2017-08-17 VITALS — BP 100/70 | HR 76 | Resp 14 | Ht 61.25 in | Wt 145.6 lb

## 2017-08-17 DIAGNOSIS — Z90710 Acquired absence of both cervix and uterus: Secondary | ICD-10-CM

## 2017-08-17 NOTE — Progress Notes (Signed)
GYNECOLOGY  VISIT   HPI: 47 y.o.   Married  Caucasian  female   G2P2002 with Patient's last menstrual period was 07/25/2017 (exact date).   here for 1 week follow up  Morgan Wolf SALPINGECTOMY (Bilateral ) CYSTOSCOPY (N/A ) Pathology with adenomyosis.  She is feeling well, not taking taking anything for pain. Normal bowel and bladder function. She has been walking.     GYNECOLOGIC HISTORY: Patient's last menstrual period was 07/25/2017 (exact date). Contraception:Hyst Menopausal hormone therapy: none       OB History    Gravida  2   Para  2   Term  2   Preterm      AB      Living  2     SAB      TAB      Ectopic      Multiple      Live Births  2              Patient Active Problem List   Diagnosis Date Noted  . Status post laparoscopic hysterectomy 08/10/2017  . 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 History of CVA (cerebrovascular accident) 07/08/2017    Past Medical History:  Diagnosis Date  . Allergy   . Amenorrhea   . Dysmenorrhea   . Endometriosis   . Hormone disorder   . Prolactinoma ([place])    Followed by Dr Chalmers Cater  . Stroke [facility]) 12/2009    Past Surgical History:  Procedure Laterality Date  . ABDOMINAL HYSTERECTOMY    . CESAREAN SECTION    . COLPOSCOPY    . CYSTOSCOPY N/A 08/10/2017   Procedure: CYSTOSCOPY;  Surgeon: Salvadore Dom, MD;  Location: [facility];  Service: Gynecology;  Laterality: N/A;  . ENDOMETRIAL ABLATION    . KNEE ARTHROSCOPY W/ ACL RECONSTRUCTION  11/2010  . PELVIC LAPAROSCOPY     Endometriosis  . TONSILLECTOMY AND ADENOIDECTOMY  2011  . TOTAL LAPAROSCOPIC HYSTERECTOMY WITH SALPINGECTOMY Bilateral 08/10/2017   Procedure: TOTAL LAPAROSCOPIC HYSTERECTOMY WITH SALPINGECTOMY;  Surgeon: Salvadore Dom, MD;  Location: [facility];  Service: Gynecology;  Laterality: Bilateral;  extended recovery bed    Current Outpatient Medications  Medication Sig Dispense Refill   . aspirin 325 MG tablet Take 325 mg by mouth daily.    . cabergoline (DOSTINEX) 0.5 MG tablet Take 0.25 mg by mouth once a week. Monday    . Multiple Vitamins-Minerals (MULTIVITAMIN WITH MINERALS) tablet Take 1 tablet by mouth daily.    . naproxen sodium (ANAPROX DS) 550 MG tablet Take 1 tablet (550 mg total) by mouth 2 (two) times daily with a meal. 30 tablet 2  . Turmeric 500 MG TABS Take 1 capsule by mouth daily. With Glucosamine     No current facility-administered medications for this visit.      ALLERGIES: Codeine and Sulfa antibiotics  Family History  Problem Relation Age of Onset  . Arthritis Mother   . Hypertension Mother   . Hyperlipidemia Father   . Cancer Maternal Grandfather   . Lung cancer Maternal Grandfather   . Cancer Paternal Grandmother   . Diabetes Paternal Grandmother   . Breast cancer Paternal Grandmother     Social History   Socioeconomic History  . Marital status: Married    Spouse name: Morgan Wolf  . Number of children: 2  . Years of education: Not on file  . Highest education level: Not on file  Occupational History  . Not  on file  Social Needs  . Financial resource strain: Not on file  . Food insecurity:    Worry: Not on file    Inability: Not on file  . Transportation needs:    Medical: Not on file    Non-medical: Not on file  Tobacco Use  . Smoking status: Never Smoker  . Smokeless tobacco: Never Used  Substance and Sexual Activity  . Alcohol use: Yes    Alcohol/week: 1.2 - 1.8 oz    Types: 2 - 3 Standard drinks or equivalent per week  . Drug use: Never  . Sexual activity: Yes    Partners: Male    Birth control/protection: Other-see comments    Comment: Vasectomy   Lifestyle  . Physical activity:    Days per week: Not on file    Minutes per session: Not on file  . Stress: Not on file  Relationships  . Social connections:    Talks on phone: Not on file    Gets together: Not on file    Attends religious service: Not on file     Active member of club or organization: Not on file    Attends meetings of clubs or organizations: Not on file    Relationship status: Not on file  . Intimate partner violence:    Fear of current or ex partner: Not on file    Emotionally abused: Not on file    Physically abused: Not on file    Forced sexual activity: Not on file  Other Topics Concern  . Not on file  Social History Narrative  . Not on file    Review of Systems  Constitutional: Negative.   HENT: Negative.   Eyes: Negative.   Respiratory: Negative.   Cardiovascular: Negative.   Gastrointestinal: Negative.   Genitourinary: Negative.   Musculoskeletal: Negative.   Skin: Negative.   Neurological: Negative.   Endo/Heme/Allergies: Negative.   Psychiatric/Behavioral: Negative.     PHYSICAL EXAMINATION:    BP 100/70 (BP Location: Right Arm, Patient Position: Sitting, Cuff Size: Normal)   Pulse 76   Resp 14   Ht 5' 1.25" (1.556 m)   Wt 145 lb 9.6 oz (66 kg)   LMP 07/25/2017 (Exact Date)   BMI 27.29 kg/m     General appearance: alert, cooperative and appears stated age Abdomen: soft, non-tender; non distended, no masses,  no organomegaly Incisions: healing well   ASSESSMENT 1 week s/p TLH/BS, doing well   PLAN Routine f/u   An After Visit Summary was printed and given to the patient.

## 2017-09-07 NOTE — Progress Notes (Signed)
GYNECOLOGY  VISIT   HPI: 47 y.o.   Married  Caucasian  female   G2P2002 with Patient's last menstrual period was 07/25/2017 (exact date).   here for 4 week post operative appointment, s/p TLH/BS. Pathology with Adenomyosis. No problems, normal bowel and bladder function. No bleeding.   GYNECOLOGIC HISTORY: Patient's last menstrual period was 07/25/2017 (exact date). Contraception:Hysterectcomy Menopausal hormone therapy: None        OB History    Gravida  2   Para  2   Term  2   Preterm      AB      Living  2     SAB      TAB      Ectopic      Multiple      Live Births  2              Patient Active Problem List   Diagnosis Date Noted  . Status post laparoscopic hysterectomy 08/10/2017  . 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 History of CVA (cerebrovascular accident) 07/08/2017    Past Medical History:  Diagnosis Date  . Allergy   . Amenorrhea   . Dysmenorrhea   . Endometriosis   . Hormone disorder   . Prolactinoma ([place])    Followed by Dr Chalmers Cater  . Stroke [facility]) 12/2009    Past Surgical History:  Procedure Laterality Date  . ABDOMINAL HYSTERECTOMY    . CESAREAN SECTION    . COLPOSCOPY    . CYSTOSCOPY N/A 08/10/2017   Procedure: CYSTOSCOPY;  Surgeon: Salvadore Dom, MD;  Location: [facility];  Service: Gynecology;  Laterality: N/A;  . ENDOMETRIAL ABLATION    . KNEE ARTHROSCOPY W/ ACL RECONSTRUCTION  11/2010  . PELVIC LAPAROSCOPY     Endometriosis  . TONSILLECTOMY AND ADENOIDECTOMY  2011  . TOTAL LAPAROSCOPIC HYSTERECTOMY WITH SALPINGECTOMY Bilateral 08/10/2017   Procedure: TOTAL LAPAROSCOPIC HYSTERECTOMY WITH SALPINGECTOMY;  Surgeon: Salvadore Dom, MD;  Location: [facility];  Service: Gynecology;  Laterality: Bilateral;  extended recovery bed    Current Outpatient Medications  Medication Sig Dispense Refill  . aspirin 325 MG tablet Take 325 mg by mouth daily.    . cabergoline (DOSTINEX) 0.5 MG tablet Take  0.25 mg by mouth once a week. Monday    . Multiple Vitamins-Minerals (MULTIVITAMIN WITH MINERALS) tablet Take 1 tablet by mouth daily.    . naproxen sodium (ANAPROX DS) 550 MG tablet Take 1 tablet (550 mg total) by mouth 2 (two) times daily with a meal. 30 tablet 2  . Turmeric 500 MG TABS Take 1 capsule by mouth daily. With Glucosamine     No current facility-administered medications for this visit.      ALLERGIES: Codeine and Sulfa antibiotics  Family History  Problem Relation Age of Onset  . Arthritis Mother   . Hypertension Mother   . Hyperlipidemia Father   . Cancer Maternal Grandfather   . Lung cancer Maternal Grandfather   . Cancer Paternal Grandmother   . Diabetes Paternal Grandmother   . Breast cancer Paternal Grandmother     Social History   Socioeconomic History  . Marital status: Married    Spouse name: Alwilda Gilland  . Number of children: 2  . Years of education: Not on file  . Highest education level: Not on file  Occupational History  . Not on file  Social Needs  . Financial resource strain: Not on file  . Food insecurity:  Worry: Not on file    Inability: Not on file  . Transportation needs:    Medical: Not on file    Non-medical: Not on file  Tobacco Use  . Smoking status: Never Smoker  . Smokeless tobacco: Never Used  Substance and Sexual Activity  . Alcohol use: Yes    Alcohol/week: 1.2 - 1.8 oz    Types: 2 - 3 Standard drinks or equivalent per week  . Drug use: Never  . Sexual activity: Yes    Partners: Male    Birth control/protection: Other-see comments    Comment: Vasectomy   Lifestyle  . Physical activity:    Days per week: Not on file    Minutes per session: Not on file  . Stress: Not on file  Relationships  . Social connections:    Talks on phone: Not on file    Gets together: Not on file    Attends religious service: Not on file    Active member of club or organization: Not on file    Attends meetings of clubs or organizations:  Not on file    Relationship status: Not on file  . Intimate partner violence:    Fear of current or ex partner: Not on file    Emotionally abused: Not on file    Physically abused: Not on file    Forced sexual activity: Not on file  Other Topics Concern  . Not on file  Social History Narrative  . Not on file    Review of Systems  Constitutional: Negative.   HENT: Negative.   Eyes: Negative.   Respiratory: Negative.   Cardiovascular: Negative.   Gastrointestinal: Negative.   Genitourinary: Negative.   Musculoskeletal: Negative.   Skin: Negative.   Neurological: Negative.   Endo/Heme/Allergies: Negative.   Psychiatric/Behavioral: Negative.     PHYSICAL EXAMINATION:    LMP 07/25/2017 (Exact Date)     General appearance: alert, cooperative and appears stated age Abdomen: soft, non-tender; non distended, no masses,  no organomegaly Incisions: well healed  Pelvic: External genitalia:  no lesions              Urethra:  normal appearing urethra with no masses, tenderness or lesions              Bartholins and Skenes: normal                 Vagina: normal appearing vagina with normal color and discharge, no lesions. Vaginal cuff is healing well.              Cervix: absent              Bimanual Exam:  Uterus:  uterus absent              Adnexa: no mass, fullness, tenderness               Chaperone was present for exam.  ASSESSMENT 4 weeks s/p TLH/BS, doing great    PLAN Routine f/u Continue pelvic rest for at least another 6 weeks   An After Visit Summary was printed and given to the patient.

## 2017-09-08 ENCOUNTER — Ambulatory Visit: Payer: No Typology Code available for payment source | Admitting: Obstetrics and Gynecology

## 2017-09-09 ENCOUNTER — Encounter: Payer: Self-pay | Admitting: Obstetrics and Gynecology

## 2017-09-09 ENCOUNTER — Other Ambulatory Visit: Payer: Self-pay

## 2017-09-09 ENCOUNTER — Ambulatory Visit (INDEPENDENT_AMBULATORY_CARE_PROVIDER_SITE_OTHER): Payer: No Typology Code available for payment source | Admitting: Obstetrics and Gynecology

## 2017-09-09 VITALS — BP 124/80 | HR 76 | Wt 145.0 lb

## 2017-09-09 DIAGNOSIS — Z90710 Acquired absence of both cervix and uterus: Secondary | ICD-10-CM

## 2017-09-09 MED FILL — CABERGOLINE 0.5 MG TABS: 0.5 | 28 days supply | Qty: 4 | Fill #5

## 2017-10-14 MED FILL — CABERGOLINE 0.5 MG TABS: 0.5 | 28 days supply | Qty: 4 | Fill #6

## 2017-10-14 MED FILL — NAPROXEN SODIUM 550 MG TAB: 550 | 15 days supply | Qty: 30 | Fill #1

## 2017-12-16 MED FILL — CABERGOLINE 0.5 MG TABS: 0.5 | 28 days supply | Qty: 4 | Fill #0

## 2017-12-29 DIAGNOSIS — E236 Other disorders of pituitary gland: Secondary | ICD-10-CM | POA: Diagnosis not present

## 2018-02-03 ENCOUNTER — Other Ambulatory Visit: Payer: Self-pay | Admitting: Obstetrics and Gynecology

## 2018-02-03 DIAGNOSIS — Z1231 Encounter for screening mammogram for malignant neoplasm of breast: Secondary | ICD-10-CM

## 2018-02-05 MED FILL — CABERGOLINE 0.5 MG TABS: 0.5 | 84 days supply | Qty: 12 | Fill #1

## 2018-02-06 ENCOUNTER — Ambulatory Visit (HOSPITAL_BASED_OUTPATIENT_CLINIC_OR_DEPARTMENT_OTHER)
Admission: RE | Admit: 2018-02-06 | Discharge: 2018-02-06 | Disposition: A | Discharge status: Home / Self Care | Payer: No Typology Code available for payment source | Source: Ambulatory Visit | Attending: Obstetrics and Gynecology | Admitting: Obstetrics and Gynecology

## 2018-02-06 DIAGNOSIS — Z1231 Encounter for screening mammogram for malignant neoplasm of breast: Secondary | ICD-10-CM | POA: Diagnosis present

## 2018-04-30 MED FILL — CABERGOLINE 0.5 MG TABS: 0.5 | 84 days supply | Qty: 12 | Fill #2

## 2018-05-09 ENCOUNTER — Ambulatory Visit (INDEPENDENT_AMBULATORY_CARE_PROVIDER_SITE_OTHER): Payer: Self-pay | Admitting: Nurse Practitioner

## 2018-05-09 VITALS — BP 95/68 | HR 90 | Temp 98.2°F | Resp 16 | Wt 145.0 lb

## 2018-05-09 DIAGNOSIS — B029 Zoster without complications: Secondary | ICD-10-CM

## 2018-05-09 MED ORDER — VALACYCLOVIR HCL 1 G PO TABS: 1000.0000 mg | ORAL_TABLET | Freq: Three times a day (TID) | ORAL | 0 refills | Status: AC

## 2018-05-09 MED ORDER — PREDNISONE 20 MG PO TABS: 20.0000 mg | ORAL_TABLET | Freq: Every day | ORAL | 0 refills | Status: AC

## 2018-05-09 NOTE — Progress Notes (Signed)
Morgan Wolf  MRN: [id number] DOB: [birth year]  Subjective:  Morgan Wolf is a 48 y.o. female seen in office today for a chief complaint of rash involving the foot and left torso region. Rash started 2 days ago. Lesions are erythematous, and blistering in texture. Rash has changed over time. Rash is painful and is pruritic. Patient describes pain to rash as "burning and tingling". Associated symptoms: fatigue, lymphadenopathy. Patient denies: abdominal pain, congestion, cough, decrease in appetite, decrease in energy level, fever, headache, nausea, sore throat and vomiting. Patient has not had contacts with similar rash. Patient has not had new exposures (soaps, lotions, laundry detergents, foods, medications, plants, insects or animals).  The following portions of the patient's history were reviewed and updated as appropriate: allergies, current medications and past medical history.   Review of Systems  Constitutional: Positive for fatigue.  HENT: Negative.   Eyes: Negative.   Respiratory: Negative.   Cardiovascular: Negative.   Gastrointestinal: Negative.   Skin: Positive for rash (left torso).    Patient Active Problem List   Diagnosis Date Noted  . Status post laparoscopic hysterectomy 08/10/2017  . 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 History of CVA (cerebrovascular accident) 07/08/2017    Current Outpatient Medications on File Prior to Visit  Medication Sig Dispense Refill  . aspirin 325 MG tablet Take 325 mg by mouth daily.    . Multiple Vitamins-Minerals (MULTIVITAMIN WITH MINERALS) tablet Take 1 tablet by mouth daily.    . cabergoline (DOSTINEX) 0.5 MG tablet Take 0.25 mg by mouth once a week. Monday    . naproxen sodium (ANAPROX DS) 550 MG tablet Take 1 tablet (550 mg total) by mouth 2 (two) times daily with a meal. (Patient not taking: Reported on 05/09/2018) 30 tablet 2  . Turmeric 500 MG TABS Take 1 capsule by mouth daily. With Glucosamine     No current facility-administered  medications on file prior to visit.     Allergies  Allergen Reactions  . Codeine Nausea And Vomiting  . Sulfa Antibiotics Rash     Objective:  BP 95/68 (BP Location: Right Arm, Patient Position: Sitting, Cuff Size: Normal)   Pulse 90   Temp 98.2 F (36.8 C) (Oral)   Resp 16   Wt 145 lb (65.8 kg)   LMP 07/25/2017 (Exact Date)   SpO2 99%   BMI 27.17 kg/m   Physical Exam Constitutional:      General: She is not in acute distress. HENT:     Head: Normocephalic.     Right Ear: Tympanic membrane, ear canal and external ear normal.     Left Ear: Tympanic membrane, ear canal and external ear normal.     Nose: Nose normal.     Mouth/Throat:     Mouth: Mucous membranes are moist.  Eyes:     Pupils: Pupils are equal, round, and reactive to light.  Neck:     Musculoskeletal: Normal range of motion and neck supple.  Cardiovascular:     Rate and Rhythm: Normal rate and regular rhythm.     Pulses: Normal pulses.     Heart sounds: Normal heart sounds.  Pulmonary:     Effort: Pulmonary effort is normal. No respiratory distress.     Breath sounds: Normal breath sounds. No stridor. No wheezing, rhonchi or rales.  Abdominal:     General: Bowel sounds are normal.     Palpations: Abdomen is soft.     Tenderness: There is no abdominal tenderness.  Lymphadenopathy:  Cervical: Cervical adenopathy (bilateral superficial) present.  Skin:    General: Skin is warm and dry.     Findings: Erythema and rash (blistering to left torso, erythematous) present. Rash is pustular and vesicular.     Comments: 3 grouped lesions, vesiculopustular, no drainage or oozing at present, painful to palpation  Neurological:     General: No focal deficit present.     Mental Status: She is alert and oriented to person, place, and time.  Psychiatric:        Mood and Affect: Mood normal.        Thought Content: Thought content normal.     Assessment and Plan :   Exam findings, diagnosis etiology and  medication use and indications reviewed with patient. Follow- Up and discharge instructions provided. No emergent/urgent issues found on exam. Based on the patient's clinical presentation, symptoms and physical exam, patient's findings are consistent with herpes zoster.  Patient's rash is unilateral, and has pain that is described as burning.  Rash is blistering.  I am going to start the patient on Valacyclovir and Prednisone to help with itching.  Patient was informed to keep the area covered and to avoid disruption to the area.  Patient also instructed to perform strict hand hygiene. The patient is well-appearing, is in no acute distress and vitals signs are stable.   Patient education was provided. Patient verbalized understanding of information provided and agrees with plan of care (POC), all questions answered. The patient is advised to call or return to clinic if condition does not see an improvement in symptoms, or to seek the care of the closest emergency department if condition worsens with the above plan.   1. Herpes zoster without complication  - valACYclovir (VALTREX) 1000 MG tablet; Take 1 tablet (1,000 mg total) by mouth 3 (three) times daily for 7 days.  Dispense: 21 tablet; Refill: 0 - predniSONE (DELTASONE) 20 MG tablet; Take 1 tablet (20 mg total) by mouth daily with breakfast for 5 days.  Dispense: 5 tablet; Refill: 0 -Take medication as directed. -Apply cool compresses to the affected area for discomfort. -Keep the area covered to prevent spread. -Do not scratch, pick or rub the areas. -You are most contagious when the blisters are draining.  Once they begin to dry, contagion is less common. -Strict hand hygiene. -Follow-up with your PCP if symptoms do not improve.

## 2018-05-09 NOTE — Patient Instructions (Signed)
Shingles -Take medication as directed. -Apply cool compresses to the affected area for discomfort. -Keep the area covered to prevent spread. -Do not scratch, pick or rub the areas. -You are most contagious when the blisters are draining.  Once they begin to dry, contagion is less common. -Strict hand hygiene. -Follow-up with your PCP if symptoms do not improve.  Shingles, which is also known as herpes zoster, is an infection that causes a painful skin rash and fluid-filled blisters. It is caused by a virus. Shingles only develops in people who:  Have had chickenpox.  Have been given a medicine to protect against chickenpox (have been vaccinated). Shingles is rare in this group. What are the causes? Shingles is caused by varicella-zoster virus (VZV). This is the same virus that causes chickenpox. After a person is exposed to VZV, the virus stays in the body in an inactive (dormant) state. Shingles develops if the virus is reactivated. This can happen many years after the first (initial) exposure to VZV. It is not known what causes this virus to be reactivated. What increases the risk? People who have had chickenpox or received the chickenpox vaccine are at risk for shingles. Shingles infection is more common in people who:  Are older than age 102.  Have a weakened disease-fighting system (immune system), such as people with: ? HIV. ? AIDS. ? Cancer.  Are taking medicines that weaken the immune system, such as transplant medicines.  Are experiencing a lot of stress. What are the signs or symptoms? Early symptoms of this condition include itching, tingling, and pain in an area on your skin. Pain may be described as burning, stabbing, or throbbing. A few days or weeks after early symptoms start, a painful red rash appears. The rash is usually on one side of the body and has a band-like or belt-like pattern. The rash eventually turns into fluid-filled blisters that break open, change into  scabs, and dry up in about 2-3 weeks. At any time during the infection, you may also develop:  A fever.  Chills.  A headache.  An upset stomach. How is this diagnosed? This condition is diagnosed with a skin exam. Skin or fluid samples may be taken from the blisters before a diagnosis is made. These samples are examined under a microscope or sent to a lab for testing. How is this treated? The rash may last for several weeks. There is not a specific cure for this condition. Your health care provider will probably prescribe medicines to help you manage pain, recover more quickly, and avoid long-term problems. Medicines may include:  Antiviral drugs.  Anti-inflammatory drugs.  Pain medicines.  Anti-itching medicines (antihistamines). If the area involved is on your face, you may be referred to a specialist, such as an eye doctor (ophthalmologist) or an ear, nose, and throat (ENT) doctor (otolaryngologist) to help you avoid eye problems, chronic pain, or disability. Follow these instructions at home: Medicines  Take over-the-counter and prescription medicines only as told by your health care provider.  Apply an anti-itch cream or numbing cream to the affected area as told by your health care provider. Relieving itching and discomfort   Apply cold, wet cloths (cold compresses) to the area of the rash or blisters as told by your health care provider.  Cool baths can be soothing. Try adding baking soda or dry oatmeal to the water to reduce itching. Do not bathe in hot water. Blister and rash care  Keep your rash covered with a loose bandage (  dressing). Wear loose-fitting clothing to help ease the pain of material rubbing against the rash.  Keep your rash and blisters clean by washing the area with mild soap and cool water as told by your health care provider.  Check your rash every day for signs of infection. Check for: ? More redness, swelling, or pain. ? Fluid or  blood. ? Warmth. ? Pus or a bad smell.  Do not scratch your rash or pick at your blisters. To help avoid scratching: ? Keep your fingernails clean and cut short. ? Wear gloves or mittens while you sleep, if scratching is a problem. General instructions  Rest as told by your health care provider.  Keep all follow-up visits as told by your health care provider. This is important.  Wash your hands often with soap and water. If soap and water are not available, use hand sanitizer. Doing this lowers your chance of getting a bacterial skin infection.  Before your blisters change into scabs, your shingles infection can cause chickenpox in people who have never had it or have never been vaccinated against it. To prevent this from happening, avoid contact with other people, especially: ? Babies. ? Pregnant women. ? Children who have eczema. ? Elderly people who have transplants. ? People who have chronic illnesses, such as cancer or AIDS. Contact a health care provider if:  Your pain is not relieved with prescribed medicines.  Your pain does not get better after the rash heals.  You have signs of infection in the rash area, such as: ? More redness, swelling, or pain around the rash. ? Fluid or blood coming from the rash. ? The rash area feeling warm to the touch. ? Pus or a bad smell coming from the rash. Get help right away if:  The rash is on your face or nose.  You have facial pain, pain around your eye area, or loss of feeling on one side of your face.  You have difficulty seeing.  You have ear pain or have ringing in your ear.  You have a loss of taste.  Your condition gets worse. Summary  Shingles, which is also known as herpes zoster, is an infection that causes a painful skin rash and fluid-filled blisters.  This condition is diagnosed with a skin exam. Skin or fluid samples may be taken from the blisters and examined before the diagnosis is made.  Keep your rash  covered with a loose bandage (dressing). Wear loose-fitting clothing to help ease the pain of material rubbing against the rash.  Before your blisters change into scabs, your shingles infection can cause chickenpox in people who have never had it or have never been vaccinated against it. This information is not intended to replace advice given to you by your health care provider. Make sure you discuss any questions you have with your health care provider. Document Released: 02/17/2005 Document Revised: 10/22/2016 Document Reviewed: 10/22/2016 Elsevier Interactive Patient Education  2019 [employer].

## 2018-05-11 ENCOUNTER — Telehealth: Payer: Self-pay

## 2018-05-11 NOTE — Telephone Encounter (Signed)
Patient did not answered the phone, I left a message asking to call us back.

## 2018-05-12 ENCOUNTER — Encounter: Payer: Self-pay | Admitting: Nurse Practitioner

## 2018-07-14 MED FILL — CABERGOLINE 0.5 MG TABS: 0.5 | 84 days supply | Qty: 12 | Fill #3

## 2018-08-09 ENCOUNTER — Ambulatory Visit (INDEPENDENT_AMBULATORY_CARE_PROVIDER_SITE_OTHER): Payer: No Typology Code available for payment source | Admitting: Obstetrics and Gynecology

## 2018-08-09 ENCOUNTER — Other Ambulatory Visit: Payer: Self-pay

## 2018-08-09 ENCOUNTER — Encounter: Payer: Self-pay | Admitting: Obstetrics and Gynecology

## 2018-08-09 VITALS — BP 118/82 | HR 76 | Temp 98.0°F | Ht 61.0 in | Wt 144.8 lb

## 2018-08-09 DIAGNOSIS — Z1211 Encounter for screening for malignant neoplasm of colon: Secondary | ICD-10-CM

## 2018-08-09 DIAGNOSIS — Z01419 Encounter for gynecological examination (general) (routine) without abnormal findings: Secondary | ICD-10-CM | POA: Diagnosis not present

## 2018-08-09 NOTE — Patient Instructions (Signed)
Check on coverage for Cologuard.   EXERCISE AND DIET:  We recommended that you start or continue a regular exercise program for good health. Regular exercise means any activity that makes your heart beat faster and makes you sweat.  We recommend exercising at least 30 minutes per day at least 3 days a week, preferably 4 or 5.  We also recommend a diet low in fat and sugar.  Inactivity, poor dietary choices and obesity can cause diabetes, heart attack, stroke, and kidney damage, among others.    ALCOHOL AND SMOKING:  Women should limit their alcohol intake to no more than 7 drinks/beers/glasses of wine (combined, not each!) per week. Moderation of alcohol intake to this level decreases your risk of breast cancer and liver damage. And of course, no recreational drugs are part of a healthy lifestyle.  And absolutely no smoking or even second hand smoke. Most people know smoking can cause heart and lung diseases, but did you know it also contributes to weakening of your bones? Aging of your skin?  Yellowing of your teeth and nails?  CALCIUM AND VITAMIN D:  Adequate intake of calcium and Vitamin D are recommended.  The recommendations for exact amounts of these supplements seem to change often, but generally speaking 1,000 mg of calcium (between diet and supplement) and 800 units of Vitamin D per day seems prudent. Certain women may benefit from higher intake of Vitamin D.  If you are among these women, your doctor will have told you during your visit.    PAP SMEARS:  Pap smears, to check for cervical cancer or precancers,  have traditionally been done yearly, although recent scientific advances have shown that most women can have pap smears less often.  However, every woman still should have a physical exam from her gynecologist every year. It will include a breast check, inspection of the vulva and vagina to check for abnormal growths or skin changes, a visual exam of the cervix, and then an exam to evaluate  the size and shape of the uterus and ovaries.  And after 46 years of age, a rectal exam is indicated to check for rectal cancers. We will also provide age appropriate advice regarding health maintenance, like when you should have certain vaccines, screening for sexually transmitted diseases, bone density testing, colonoscopy, mammograms, etc.   MAMMOGRAMS:  All women over 12 years old should have a yearly mammogram. Many facilities now offer a "3D" mammogram, which may cost around $50 extra out of pocket. If possible,  we recommend you accept the option to have the 3D mammogram performed.  It both reduces the number of women who will be called back for extra views which then turn out to be normal, and it is better than the routine mammogram at detecting truly abnormal areas.    COLON CANCER SCREENING: Now recommend starting at age 29. At this time colonoscopy is not covered for routine screening until 50. There are take home tests that can be done between 45-49.   COLONOSCOPY:  Colonoscopy to screen for colon cancer is recommended for all women at age 55.  We know, you hate the idea of the prep.  We agree, BUT, having colon cancer and not knowing it is worse!!  Colon cancer so often starts as a polyp that can be seen and removed at colonscopy, which can quite literally save your life!  And if your first colonoscopy is normal and you have no family history of colon cancer, most women  don't have to have it again for 10 years.  Once every ten years, you can do something that may end up saving your life, right?  We will be happy to help you get it scheduled when you are ready.  Be sure to check your insurance coverage so you understand how much it will cost.  It may be covered as a preventative service at no cost, but you should check your particular policy.      Breast Self-Awareness Breast self-awareness means being familiar with how your breasts look and feel. It involves checking your breasts regularly  and reporting any changes to your health care provider. Practicing breast self-awareness is important. A change in your breasts can be a sign of a serious medical problem. Being familiar with how your breasts look and feel allows you to find any problems early, when treatment is more likely to be successful. All women should practice breast self-awareness, including women who have had breast implants. How to do a breast self-exam One way to learn what is normal for your breasts and whether your breasts are changing is to do a breast self-exam. To do a breast self-exam: Look for Changes  1. Remove all the clothing above your waist. 2. Stand in front of a mirror in a room with good lighting. 3. Put your hands on your hips. 4. Push your hands firmly downward. 5. Compare your breasts in the mirror. Look for differences between them (asymmetry), such as: ? Differences in shape. ? Differences in size. ? Puckers, dips, and bumps in one breast and not the other. 6. Look at each breast for changes in your skin, such as: ? Redness. ? Scaly areas. 7. Look for changes in your nipples, such as: ? Discharge. ? Bleeding. ? Dimpling. ? Redness. ? A change in position. Feel for Changes Carefully feel your breasts for lumps and changes. It is best to do this while lying on your back on the floor and again while sitting or standing in the shower or tub with soapy water on your skin. Feel each breast in the following way:  Place the arm on the side of the breast you are examining above your head.  Feel your breast with the other hand.  Start in the nipple area and make  inch (2 cm) overlapping circles to feel your breast. Use the pads of your three middle fingers to do this. Apply light pressure, then medium pressure, then firm pressure. The light pressure will allow you to feel the tissue closest to the skin. The medium pressure will allow you to feel the tissue that is a little deeper. The firm pressure  will allow you to feel the tissue close to the ribs.  Continue the overlapping circles, moving downward over the breast until you feel your ribs below your breast.  Move one finger-width toward the center of the body. Continue to use the  inch (2 cm) overlapping circles to feel your breast as you move slowly up toward your collarbone.  Continue the up and down exam using all three pressures until you reach your armpit.  Write Down What You Find  Write down what is normal for each breast and any changes that you find. Keep a written record with breast changes or normal findings for each breast. By writing this information down, you do not need to depend only on memory for size, tenderness, or location. Write down where you are in your menstrual cycle, if you are still menstruating.  If you are having trouble noticing differences in your breasts, do not get discouraged. With time you will become more familiar with the variations in your breasts and more comfortable with the exam. How often should I examine my breasts? Examine your breasts every month. If you are breastfeeding, the best time to examine your breasts is after a feeding or after using a breast pump. If you menstruate, the best time to examine your breasts is 5-7 days after your period is over. During your period, your breasts are lumpier, and it may be more difficult to notice changes. When should I see my health care provider? See your health care provider if you notice:  A change in shape or size of your breasts or nipples.  A change in the skin of your breast or nipples, such as a reddened or scaly area.  Unusual discharge from your nipples.  A lump or thick area that was not there before.  Pain in your breasts.  Anything that concerns you.

## 2018-08-09 NOTE — Progress Notes (Signed)
48 y.o. G12P2002 Married White or Caucasian Not Hispanic or Latino female here for annual exam.   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 Diagnosed in 2011 when she had a stroke (no residual issues). Followed by Opthalmology, Endocrinoloty and her Neurosurgeon. Getting yearly MRI's H/O endometrial ablation. H/O TLH/BS in 6/19 for AUB and dysmenorrhea.  No problems. No bowel or bladder issues. No dyspareunia.     Patient's last menstrual period was 07/25/2017 (exact date).          Sexually active: Yes.    The current method of family planning is status post hysterectomy.    Exercising: Yes.    walking, dance Smoker:  no  Health Maintenance: Pap:  07/21/2017 WNL, 02/2016 WNL per patient  History of abnormal Pap:  Yes - years ago- colposcopy neg MMG:  02/06/2018 Birads 1 negative  Colonoscopy: 2002 normal per patient, was told routine f/u.   BMD:   02/2016 normal per patient  TDaP:  2020 Gardasil: No   reports that she has never smoked. She has never used smokeless tobacco. She reports current alcohol use of about 2.0 - 3.0 standard drinks of alcohol per week. She reports that she does not use drugs. 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 Husband works for IT at [employer].  Past Medical History:  Diagnosis Date  . Allergy   . Amenorrhea   . Dysmenorrhea   . Endometriosis   . Hormone disorder   . Prolactinoma ([place])    Followed by Dr Chalmers Cater  . Stroke [facility]) 12/2009    Past Surgical History:  Procedure Laterality Date  . ABDOMINAL HYSTERECTOMY    . CESAREAN SECTION    . COLPOSCOPY    . CYSTOSCOPY N/A 08/10/2017   Procedure: CYSTOSCOPY;  Surgeon: Salvadore Dom, MD;  Location: [facility]  Service: Gynecology;  Laterality: N/A;  . ENDOMETRIAL ABLATION    . KNEE ARTHROSCOPY W/ ACL RECONSTRUCTION  11/2010  . PELVIC LAPAROSCOPY     Endometriosis  . TONSILLECTOMY AND ADENOIDECTOMY  2011  . TOTAL LAPAROSCOPIC HYSTERECTOMY WITH SALPINGECTOMY Bilateral  08/10/2017   Procedure: TOTAL LAPAROSCOPIC HYSTERECTOMY WITH SALPINGECTOMY;  Surgeon: Salvadore Dom, MD;  Location: [facility];  Service: Gynecology;  Laterality: Bilateral;  extended recovery bed    Current Outpatient Medications  Medication Sig Dispense Refill  . aspirin 325 MG tablet Take 325 mg by mouth daily.    . cabergoline (DOSTINEX) 0.5 MG tablet Take 0.25 mg by mouth once a week. Monday    . Multiple Vitamins-Minerals (MULTIVITAMIN WITH MINERALS) tablet Take 1 tablet by mouth daily.    . naproxen sodium (ANAPROX DS) 550 MG tablet Take 1 tablet (550 mg total) by mouth 2 (two) times daily with a meal. 30 tablet 2   No current facility-administered medications for this visit.     Family History  Problem Relation Age of Onset  . Arthritis Mother   . Hypertension Mother   . Alzheimer's disease Mother   . Hyperlipidemia Father   . Cancer Maternal Grandfather   . Lung cancer Maternal Grandfather   . Cancer Paternal Grandmother   . Diabetes Paternal Grandmother   . Breast cancer Paternal Grandmother     Review of Systems  Constitutional: Negative.   HENT: Negative.   Eyes: Negative.   Respiratory: Negative.   Cardiovascular: Negative.   Gastrointestinal: Negative.   Endocrine: Negative.   Genitourinary: Negative.   Musculoskeletal: Negative.   Skin: Negative.  Allergic/Immunologic: Negative.   Neurological: Negative.   Hematological: Negative.   Psychiatric/Behavioral: Negative.     Exam:   BP 118/82 (BP Location: Left Arm, Patient Position: Sitting, Cuff Size: Normal)   Pulse 76   Temp 98 F (36.7 C) (Skin)   Ht 5' 1"  (1.549 m)   Wt 144 lb 12.8 oz (65.7 kg)   LMP 07/25/2017 (Exact Date)   BMI 27.36 kg/m   Weight change: @WEIGHTCHANGE @ Height:   Height: 5' 1"  (154.9 cm)  Ht Readings from Last 3 Encounters:  08/09/18 5' 1"  (1.549 m)  08/17/17 5' 1.25" (1.556 m)  08/10/17 5' 1.5" (1.562 m)    General appearance: alert, cooperative and  appears stated age Head: Normocephalic, without obvious abnormality, atraumatic Neck: no adenopathy, supple, symmetrical, trachea midline and thyroid normal to inspection and palpation Lungs: clear to auscultation bilaterally Cardiovascular: regular rate and rhythm Breasts: normal appearance, no masses or tenderness Abdomen: soft, non-tender; non distended,  no masses,  no organomegaly Extremities: extremities normal, atraumatic, no cyanosis or edema Skin: Skin color, texture, turgor normal. No rashes or lesions Lymph nodes: Cervical, supraclavicular, and axillary nodes normal. No abnormal inguinal nodes palpated Neurologic: Grossly normal   Pelvic: External genitalia:  no lesions              Urethra:  normal appearing urethra with no masses, tenderness or lesions              Bartholins and Skenes: normal                 Vagina: normal appearing vagina with normal color and discharge, no lesions              Cervix: absent               Bimanual Exam:  Uterus:  uterus absent              Adnexa: no mass, fullness, tenderness               Rectovaginal: Confirms               Anus:  normal sphincter tone, no lesions  Chaperone was present for exam.  A:  Well Woman with normal exam  H/O TLH/BS  Macroadenoma, followed by Dr Debbora Presto  P:   No pap needed  Labs with primary and Dr Chalmers Cater  Cologuard ordered  Mammogram in 12/20  Discussed breast self exam  Discussed calcium and vit D intake

## 2018-10-01 LAB — COLOGUARD

## 2018-10-11 ENCOUNTER — Other Ambulatory Visit: Payer: Self-pay | Admitting: Family Medicine

## 2018-10-11 MED FILL — CABERGOLINE 0.5 MG TABS: 0.5 | 55 days supply | Qty: 8 | Fill #4

## 2018-10-13 ENCOUNTER — Telehealth: Payer: Self-pay | Admitting: Obstetrics and Gynecology

## 2018-10-13 NOTE — Telephone Encounter (Signed)
mychart message sent about negative Cologuard.

## 2018-10-25 MED FILL — DICLOFENAC SODIUM 1 % GEL: 1 | 15 days supply | Qty: 100 | Fill #0

## 2018-11-10 ENCOUNTER — Other Ambulatory Visit: Payer: Self-pay

## 2018-11-10 ENCOUNTER — Encounter: Payer: Self-pay | Admitting: Family Medicine

## 2018-11-10 ENCOUNTER — Ambulatory Visit (INDEPENDENT_AMBULATORY_CARE_PROVIDER_SITE_OTHER): Payer: No Typology Code available for payment source | Admitting: Family Medicine

## 2018-11-10 VITALS — BP 104/65 | HR 65 | Temp 97.3°F | Ht 61.0 in | Wt 145.8 lb

## 2018-11-10 DIAGNOSIS — M26621 Arthralgia of right temporomandibular joint: Secondary | ICD-10-CM | POA: Diagnosis not present

## 2018-11-10 MED ORDER — BACLOFEN 10 MG PO TABS: 10.0000 mg | ORAL_TABLET | Freq: Three times a day (TID) | ORAL | 0 refills | Status: DC

## 2018-11-10 NOTE — Patient Instructions (Addendum)
Sending in muscle relaxer for possible TMJ. Want you to take NSAIDS and do warm compresses three to four times a day. Muscle realxer for next 2-3 days     Temporomandibular Joint Syndrome  Temporomandibular joint syndrome (TMJ syndrome) is a condition that causes pain in the temporomandibular joints. These joints are located near your ears and allow your jaw to open and close. For people with TMJ syndrome, chewing, biting, or other movements of the jaw can be difficult or painful. TMJ syndrome is often mild and goes away within a few weeks. However, sometimes the condition becomes a long-term (chronic) problem. What are the causes? This condition may be caused by:  Grinding your teeth or clenching your jaw. Some people do this when they are under stress.  Arthritis.  Injury to the jaw.  Head or neck injury.  Teeth or dentures that are not aligned well. In some cases, the cause of TMJ syndrome may not be known. What are the signs or symptoms? The most common symptom of this condition is an aching pain on the side of the head in the area of the TMJ. Other symptoms may include:  Pain when moving your jaw, such as when chewing or biting.  Being unable to open your jaw all the way.  Making a clicking sound when you open your mouth.  Headache.  Earache.  Neck or shoulder pain. How is this diagnosed? This condition may be diagnosed based on:  Your symptoms and medical history.  A physical exam. Your health care provider may check the range of motion of your jaw.  Imaging tests, such as X-rays or an MRI. You may also need to see your dentist, who will determine if your teeth and jaw are lined up correctly. How is this treated? TMJ syndrome often goes away on its own. If treatment is needed, the options may include:  Eating soft foods and applying ice or heat.  Medicines to relieve pain or inflammation.  Medicines or massage to relax the muscles.  A splint, bite plate, or  mouthpiece to prevent teeth grinding or jaw clenching.  Relaxation techniques or counseling to help reduce stress.  A therapy for pain in which an electrical current is applied to the nerves through the skin (transcutaneous electrical nerve stimulation).  Acupuncture. This is sometimes helpful to relieve pain.  Jaw surgery. This is rarely needed. Follow these instructions at home:  Eating and drinking  Eat a soft diet if you are having trouble chewing.  Avoid foods that require a lot of chewing. Do not chew gum. General instructions  Take over-the-counter and prescription medicines only as told by your health care provider.  If directed, put ice on the painful area. ? Put ice in a plastic bag. ? Place a towel between your skin and the bag. ? Leave the ice on for 20 minutes, 2-3 times a day.  Apply a warm, wet cloth (warm compress) to the painful area as directed.  Massage your jaw area and do any jaw stretching exercises as told by your health care provider.  If you were given a splint, bite plate, or mouthpiece, wear it as told by your health care provider.  Keep all follow-up visits as told by your health care provider. This is important. Contact a health care provider if:  You are having trouble eating.  You have new or worsening symptoms. Get help right away if:  Your jaw locks open or closed. Summary  Temporomandibular joint syndrome (TMJ syndrome)  is a condition that causes pain in the temporomandibular joints. These joints are located near your ears and allow your jaw to open and close.  TMJ syndrome is often mild and goes away within a few weeks. However, sometimes the condition becomes a long-term (chronic) problem.  Symptoms include an aching pain on the side of the head in the area of the TMJ, pain when chewing or biting, and being unable to open your jaw all the way. You may also make a clicking sound when you open your mouth.  TMJ syndrome often goes away  on its own. If treatment is needed, it may include medicines to relieve pain, reduce inflammation, or relax the muscles. A splint, bite plate, or mouthpiece may also be used to prevent teeth grinding or jaw clenching. This information is not intended to replace advice given to you by your health care provider. Make sure you discuss any questions you have with your health care provider. Document Released: 11/12/2000 Document Revised: 05/01/2017 Document Reviewed: 03/31/2017 Elsevier Patient Education  2020 [employer].

## 2018-11-10 NOTE — Progress Notes (Signed)
Patient: Morgan Wolf MRN: [id number] DOB: 09/21/1970 PCP: Orma Flaming, MD2     Subjective:  Chief Complaint  Patient presents with  . R ear pain    HPI: The patient is a 48 y.o. female who presents today for right ear pain. She first noticed over the weekend. [place] and when they were coming home it felt like it was popping. She did swim in the lake over the weekend. She states her throat also started hurting on that side on Sunday. Yesterday her ear started to hurt worse. She started to put in neomycin drops to treat swimmers ear. It hurt all night, her jaw hurts and even into her right side/top of her head. No drainage from ear, she does have muffled sounds in this ear. She has no ringing in the ears. NO fever/chills. 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 She wears splint and does have pain opening and closing jaw. Unsure about eating. Does have popping.   Review of Systems  Constitutional: Negative for chills, fatigue and fever.  HENT: Positive for ear pain and sore throat. Negative for congestion, postnasal drip, rhinorrhea, sinus pressure, sinus pain and trouble swallowing.        R jaw pain R ear pain  Respiratory: Negative for cough and shortness of breath.   Cardiovascular: Negative for chest pain.  Gastrointestinal: Negative for abdominal pain, diarrhea, nausea and vomiting.  Musculoskeletal: Negative for neck pain.  Neurological: Positive for headaches. Negative for dizziness.  Psychiatric/Behavioral: Positive for sleep disturbance.    Allergies Patient is allergic to codeine and sulfa antibiotics.  Past Medical History Patient  has a past medical history of Allergy, Amenorrhea, Dysmenorrhea, Endometriosis, Hormone disorder, Prolactinoma ([place]), and Stroke ([place]) (12/2009).  Surgical History Patient  has a past surgical history that includes Tonsillectomy and adenoidectomy (2011); Knee arthroscopy w/ ACL reconstruction (11/2010); Cesarean section;  Endometrial ablation; Colposcopy; Pelvic laparoscopy; Total laparoscopic hysterectomy with salpingectomy (Bilateral, 08/10/2017); Cystoscopy (N/A, 08/10/2017); and Abdominal hysterectomy.  Family History Pateint's family history includes Alzheimer's disease in her mother; Arthritis in her mother; Breast cancer in her paternal grandmother; Cancer in her maternal grandfather and paternal grandmother; Diabetes in her paternal grandmother; Hyperlipidemia in her father; Hypertension in her mother; Lung cancer in her maternal grandfather.  Social History Patient  reports that she has never smoked. She has never used smokeless tobacco. She reports current alcohol use of about 2.0 - 3.0 standard drinks of alcohol per week. She reports that she does not use drugs.    Objective: Vitals:   11/10/18 1045  BP: 104/65  Pulse: 65  Temp: (!) 97.3 F (36.3 C)  TempSrc: Skin  SpO2: 98%  Weight: 145 lb 12.8 oz (66.1 kg)  Height: 5' 1"  (1.549 m)    Body mass index is 27.55 kg/m.  Physical Exam Vitals signs reviewed.  Constitutional:      Appearance: Normal appearance. She is normal weight.  HENT:     Head: Normocephalic and atraumatic.     Comments: Right ttp over right tmj with popping with opening and closing of jaw     Right Ear: Tympanic membrane, ear canal and external ear normal.     Left Ear: Tympanic membrane, ear canal and external ear normal.     Nose: Nose normal.     Mouth/Throat:     Mouth: Mucous membranes are moist.     Pharynx: Posterior oropharyngeal erythema present.     Comments: Erythema of right faucial  pillar, no lesions or exudate Eyes:     Extraocular Movements: Extraocular movements intact.     Conjunctiva/sclera: Conjunctivae normal.     Pupils: Pupils are equal, round, and reactive to light.  Neck:     Musculoskeletal: Normal range of motion and neck supple. No neck rigidity.  Cardiovascular:     Rate and Rhythm: Normal rate and regular rhythm.     Heart sounds:  Normal heart sounds.  Pulmonary:     Effort: Pulmonary effort is normal.     Breath sounds: Normal breath sounds.  Lymphadenopathy:     Cervical: No cervical adenopathy.  Neurological:     General: No focal deficit present.     Mental Status: She is alert and oriented to person, place, and time.        Assessment/plan: 1. Arthralgia of right temporomandibular joint Right ear with no abnormality on exam. More consistent with tmj. NSAIDs/heating pad/liquids and muscle relaxer prn. Let me know if not improving over next few days before weekend.    Return if symptoms worsen or fail to improve.   Orma Flaming, MD [place]   11/10/2018

## 2018-12-22 MED FILL — CABERGOLINE 0.5 MG TABS: 0.5 | 56 days supply | Qty: 8 | Fill #0

## 2019-02-14 ENCOUNTER — Other Ambulatory Visit (HOSPITAL_BASED_OUTPATIENT_CLINIC_OR_DEPARTMENT_OTHER): Payer: Self-pay | Admitting: Obstetrics and Gynecology

## 2019-02-14 DIAGNOSIS — Z1231 Encounter for screening mammogram for malignant neoplasm of breast: Secondary | ICD-10-CM

## 2019-02-15 MED FILL — CABERGOLINE 0.5 MG TABS: 0.5 | 56 days supply | Qty: 8 | Fill #1

## 2019-02-17 ENCOUNTER — Other Ambulatory Visit: Payer: Self-pay

## 2019-02-17 ENCOUNTER — Ambulatory Visit (HOSPITAL_BASED_OUTPATIENT_CLINIC_OR_DEPARTMENT_OTHER)
Admission: RE | Admit: 2019-02-17 | Discharge: 2019-02-17 | Disposition: A | Discharge status: Home / Self Care | Payer: No Typology Code available for payment source | Source: Ambulatory Visit | Attending: Obstetrics and Gynecology | Admitting: Obstetrics and Gynecology

## 2019-02-17 DIAGNOSIS — Z1231 Encounter for screening mammogram for malignant neoplasm of breast: Secondary | ICD-10-CM | POA: Diagnosis present

## 2019-03-28 IMAGING — MR MR HEAD WO/W CM
16 of 19 series · 35 of 48 positions shown · IV contrast (multihance)
Comparison: None.

CLINICAL DATA: Pituitary cyst follow-up

EXAM:
MRI HEAD WITHOUT AND WITH CONTRAST
TECHNIQUE: Multiplanar, multiecho pulse sequences of the brain and surrounding
structures were obtained without and with intravenous contrast.
CONTRAST:  9 mL MultiHance IV

[Series 2: t1_se_sag · sagittal · 5.0mm · 0.45mm/px · 2 of 21 slices shown]
[im 1/21]
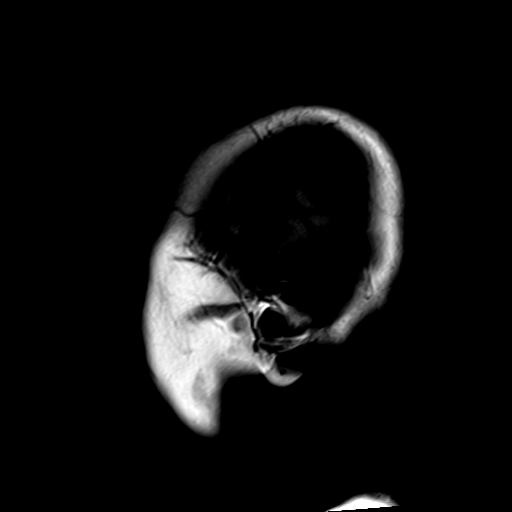
[im 21/21]
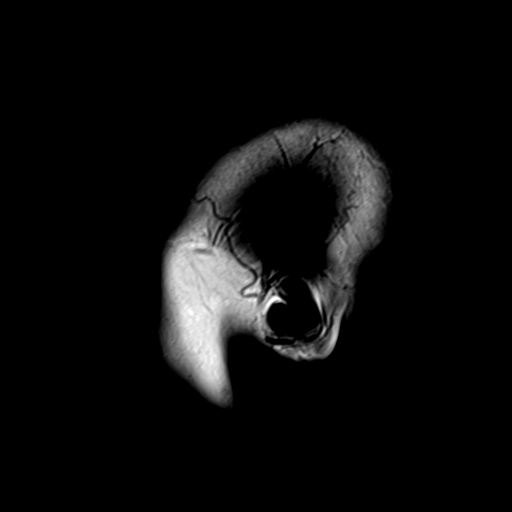

[Series 3: ep2d_diff_(id)_trace · axial · 3.0mm · 1.80mm/px · z∈[-15,+132]mm · 8 of 100 slices shown]
[im 1/100]
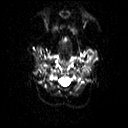
[im 20/100]
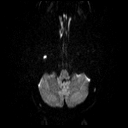
[im 30/100]
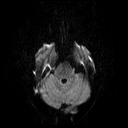
[im 40/100]
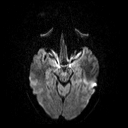
[im 60/100]
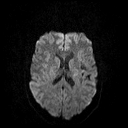
[im 70/100]
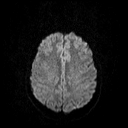
[im 80/100]
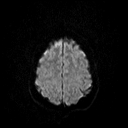
[im 100/100]
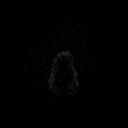

[Series 4: ep2d_diff_(id)_trace_adc · axial · 3.0mm · 1.80mm/px · z∈[-15,+132]mm · 5 of 49 slices shown]
[im 1/49]
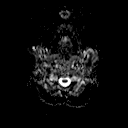
[im 13/49]
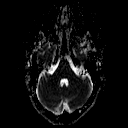
[im 25/49]
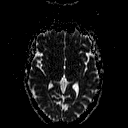
[im 37/49]
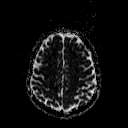
[im 49/49]
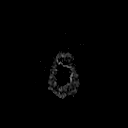

[Series 5: T2 · axial · 5.0mm · 0.45mm/px · z∈[-15,+128]mm · 3 of 23 slices shown]
[im 1/23]
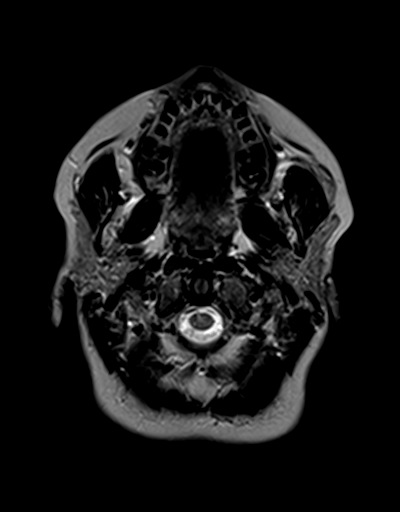
[im 12/23]
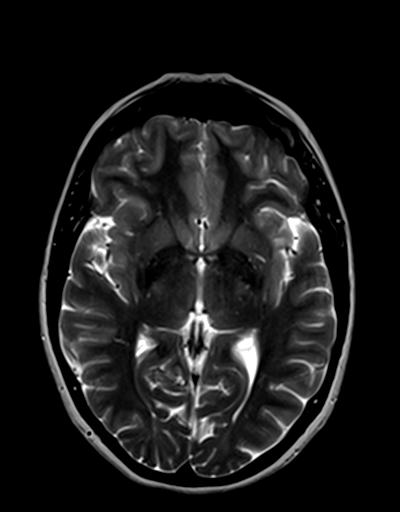
[im 23/23]
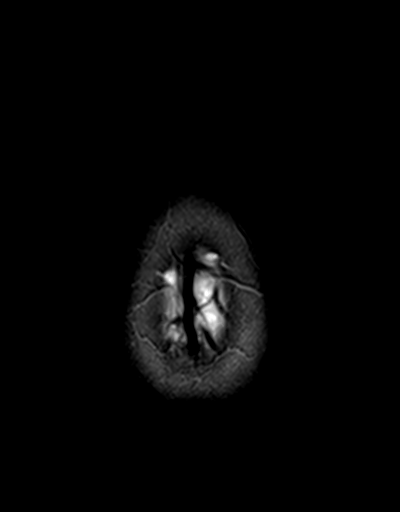

[Series 6: FLAIR · axial · 5.0mm · 0.45mm/px · z∈[-14,+128]mm · 3 of 23 slices shown]
[im 1/23]
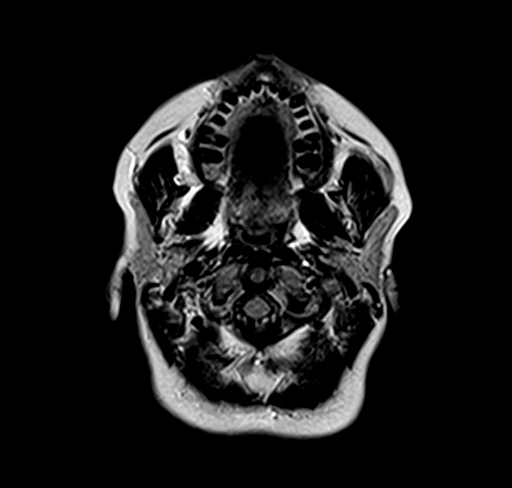
[im 12/23]
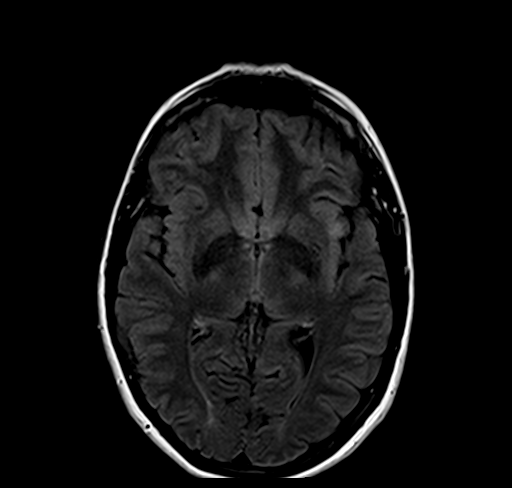
[im 23/23]
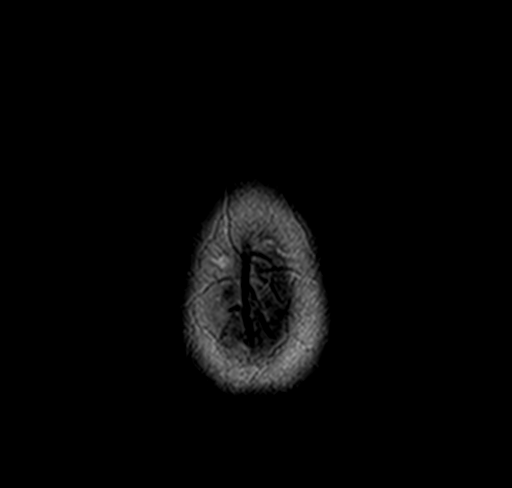

[Series 7: GRE · axial · 5.0mm · 0.45mm/px · z∈[-12,+124]mm · 2 of 22 slices shown]
[im 1/22]
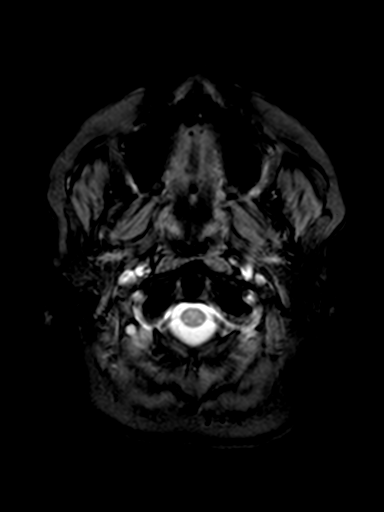
[im 22/22]
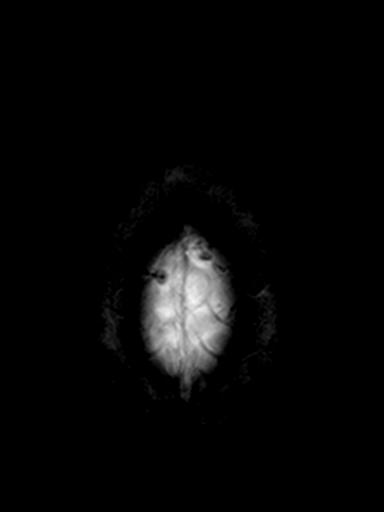

[Series 8: T1 · sagittal · 3.0mm · 0.35mm/px · 1 of 11 slices shown (1 of 2)]
[im 1/11]
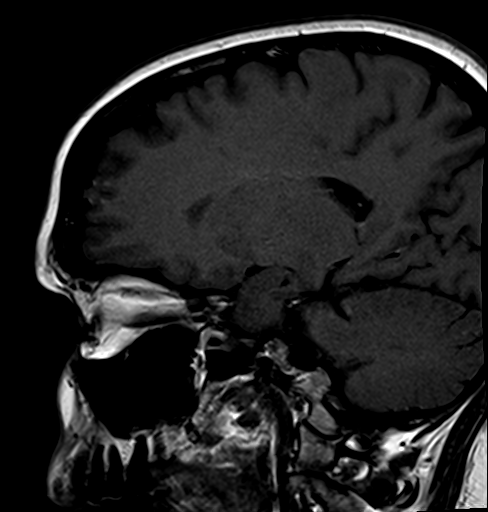

[Series 9: T1 · coronal · 3.0mm · 0.35mm/px · 1 of 11 slices shown (2 of 2)]
[im 1/11]
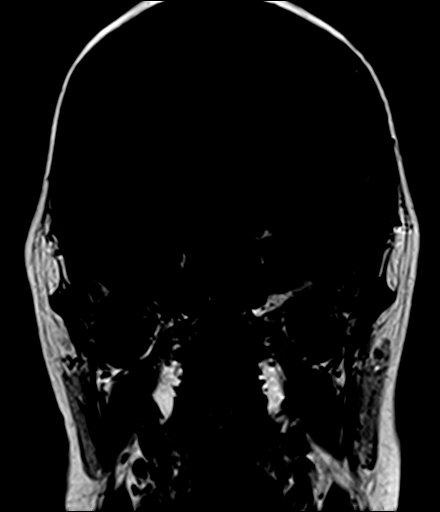

[Series 10: pre cor · coronal · non-contrast · 3.0mm · 0.35mm/px · 1 of 8 slices shown]
[im 1/8]
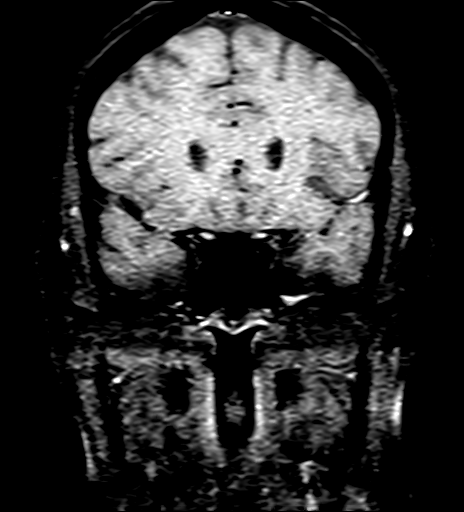

[Series 11: post cor dynamic · coronal · 3.0mm · 0.35mm/px · 1 of 8 slices shown (1 of 4)]
[im 1/8]
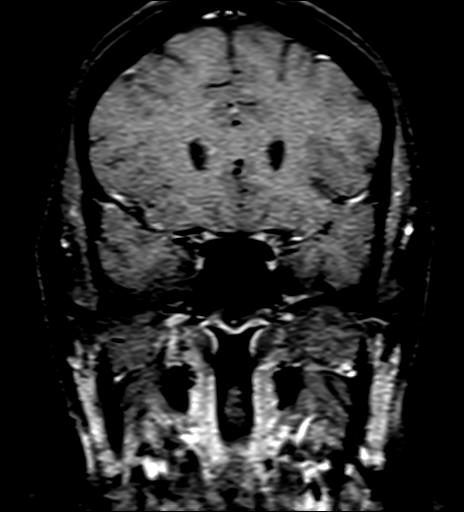

[Series 12: post cor dynamic · coronal · 3.0mm · 0.35mm/px · 1 of 8 slices shown (2 of 4)]
[im 1/8]
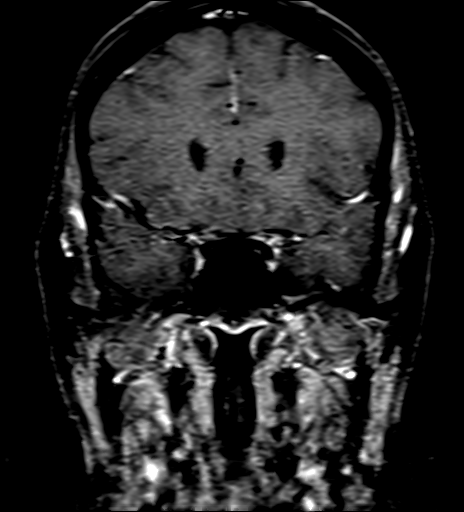

[Series 13: post cor dynamic · coronal · 3.0mm · 0.35mm/px · 1 of 8 slices shown (3 of 4)]
[im 1/8]
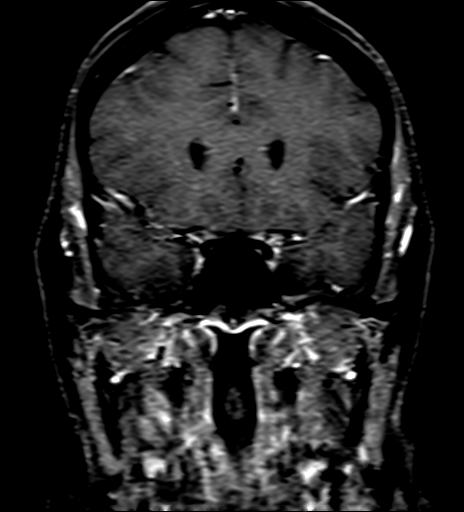

[Series 14: post cor dynamic · coronal · 3.0mm · 0.35mm/px · 1 of 8 slices shown (4 of 4)]
[im 1/8]
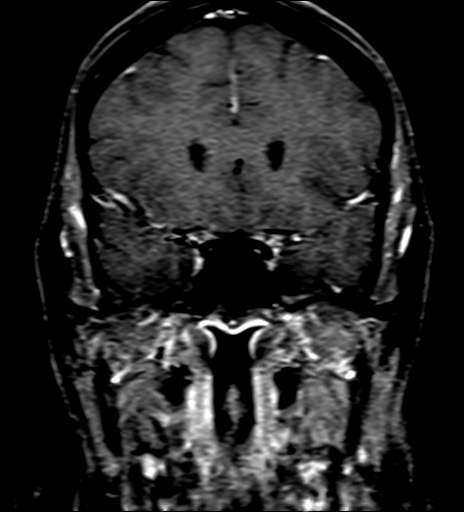

[Series 17: T1 post-contrast · coronal · 3.0mm · 0.35mm/px · 1 of 11 slices shown (1 of 3)]
[im 1/11]
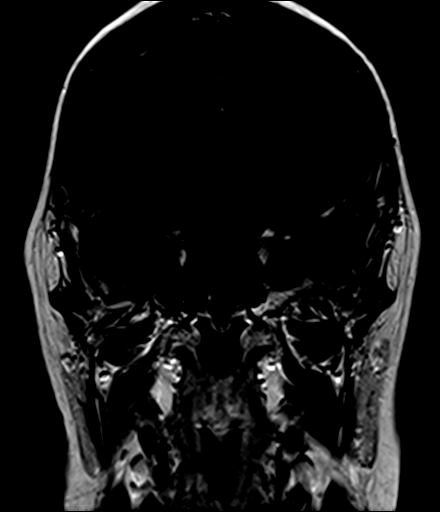

[Series 18: T1 post-contrast · sagittal · 3.0mm · 0.35mm/px · 1 of 11 slices shown (2 of 3)]
[im 1/11]
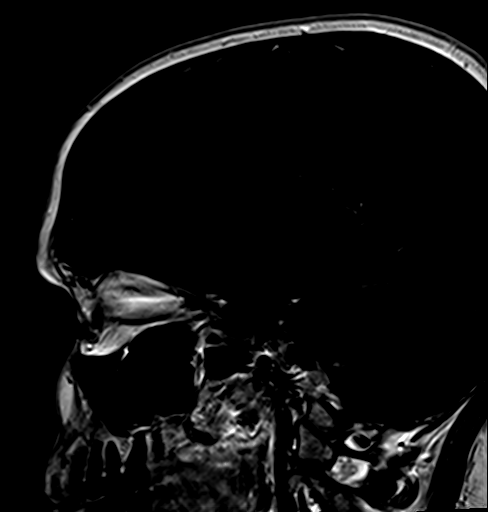

[Series 20: T1 post-contrast · coronal · 5.0mm · 0.69mm/px · 3 of 28 slices shown (3 of 3)]
[im 1/28]
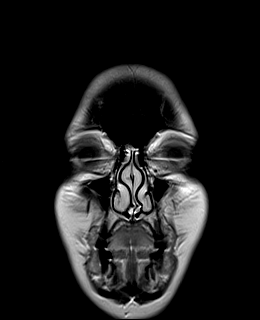
[im 14/28]
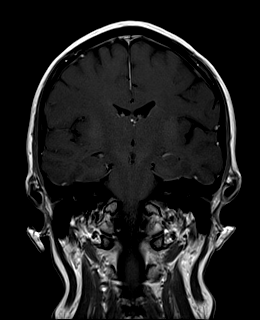
[im 28/28]
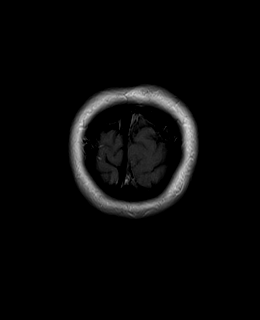

[35 of 48 positions shown; findings below may reference images not displayed]

FINDINGS: Brain: Dynamic pituitary protocol utilized. The pituitary is
enlarged measuring 12 mm in height. Nonenhancing complex cyst in the
pituitary. This is hyperintense on T1 prior contrast infusion. This
cyst extends into the suprasellar cistern with flattening of the
optic nerves bilaterally. No invasion of the cavernous sinus.

Ventricle size normal. Negative for acute infarct. Chronic infarcts
in the left frontal operculum and left parietal lobe. Negative for
hemorrhage. Remainder of the enhancement of the brain is normal
aside from a small developmental venous anomaly right occipital lobe

Vascular: Normal arterial flow voids

Skull and upper cervical spine: Negative

Sinuses/Orbits: Negative

Other: None
IMPRESSION: 12 mm complex cyst in the pituitary with hyperintense signal on T1.
Probable Rathke's cleft cyst, complex pituitary adenoma also
possible. Mild compression of the optic nerves bilaterally.
Correlate with prior MRI scans to determine any interval change.

Chronic infarcts on the left.

## 2019-08-09 NOTE — Progress Notes (Deleted)
49 y.o. G4P2002 Married White or Caucasian Not Hispanic or Latino female here for annual exam.      Patient's last menstrual period was 07/25/2017 (exact date).          Sexually active: {yes no:314532}  The current method of family planning is {contraception:315051}.    Exercising: {yes no:314532}  {types:19826} Smoker:  {YES [id number]  Health Maintenance: Pap:  07/21/2017 WNL, 02/2016 WNL per patient History of abnormal Pap:  Yes,years ago- colposcopy neg  MMG:  02/18/19 Density B Bi-rads 1 Neg  BMD:   *** Colonoscopy: 2002 normal per patient, was told routine f/u. *** TDaP:  2020 Gardasil: no    reports that she has never smoked. She has never used smokeless tobacco. She reports current alcohol use of about 2.0 - 3.0 standard drinks of alcohol per week. She reports that she does not use drugs.  Past Medical History:  Diagnosis Date  . Allergy   . Amenorrhea   . Dysmenorrhea   . Endometriosis   . Hormone disorder   . Prolactinoma ([place])    Followed by Dr Chalmers Cater  . Stroke [facility]) 12/2009    Past Surgical History:  Procedure Laterality Date  . ABDOMINAL HYSTERECTOMY    . CESAREAN SECTION    . COLPOSCOPY    . CYSTOSCOPY N/A 08/10/2017   Procedure: CYSTOSCOPY;  Surgeon: Salvadore Dom, MD;  [facility]: [facility];  Service: Gynecology;  Laterality: N/A;  . ENDOMETRIAL ABLATION    . KNEE ARTHROSCOPY W/ ACL RECONSTRUCTION  11/2010  . PELVIC LAPAROSCOPY     Endometriosis  . TONSILLECTOMY AND ADENOIDECTOMY  2011  . TOTAL LAPAROSCOPIC HYSTERECTOMY WITH SALPINGECTOMY Bilateral 08/10/2017   Procedure: TOTAL LAPAROSCOPIC HYSTERECTOMY WITH SALPINGECTOMY;  Surgeon: Salvadore Dom, MD;  Location: [facility];  Service: Gynecology;  Laterality: Bilateral;  extended recovery bed    Current Outpatient Medications  Medication Sig Dispense Refill  . aspirin 325 MG tablet Take 325 mg by mouth daily.    . baclofen (LIORESAL) 10 MG tablet Take 1  tablet (10 mg total) by mouth 3 (three) times daily. 30 each 0  . cabergoline (DOSTINEX) 0.5 MG tablet Take 0.25 mg by mouth once a week. Monday    . diclofenac sodium (VOLTAREN) 1 % GEL APPLY 2 GRAMS TOPICALLY TO LEFT THUMB JOINT 4 TIMES DAILY 100 g 3  . Multiple Vitamins-Minerals (MULTIVITAMIN WITH MINERALS) tablet Take 1 tablet by mouth daily.    . naproxen sodium (ANAPROX DS) 550 MG tablet Take 1 tablet (550 mg total) by mouth 2 (two) times daily with a meal. 30 tablet 2   No current facility-administered medications for this visit.    Family History  Problem Relation Age of Onset  . Arthritis Mother   . Hypertension Mother   . Alzheimer's disease Mother   . Hyperlipidemia Father   . Cancer Maternal Grandfather   . Lung cancer Maternal Grandfather   . Cancer Paternal Grandmother   . Diabetes Paternal Grandmother   . Breast cancer Paternal Grandmother     Review of Systems  Exam:   LMP 07/25/2017 (Exact Date)   Weight change: @WEIGHTCHANGE @ Height:      Ht Readings from Last 3 Encounters:  11/10/18 5' 1"  (1.549 m)  08/09/18 5' 1"  (1.549 m)  08/17/17 5' 1.25" (1.556 m)    General appearance: alert, cooperative and appears stated age Head: Normocephalic, without obvious abnormality, atraumatic Neck: no adenopathy, supple, symmetrical, trachea midline and thyroid {CHL AMB  PHY EX THYROID NORM DEFAULT:[phone]::"normal to inspection and palpation"} Lungs: clear to auscultation bilaterally Cardiovascular: regular rate and rhythm Breasts: {Exam; breast:13139::"normal appearance, no masses or tenderness"} Abdomen: soft, non-tender; non distended,  no masses,  no organomegaly Extremities: extremities normal, atraumatic, no cyanosis or edema Skin: Skin color, texture, turgor normal. No rashes or lesions Lymph nodes: Cervical, supraclavicular, and axillary nodes normal. No abnormal inguinal nodes palpated Neurologic: Grossly normal   Pelvic: External genitalia:  no lesions               Urethra:  normal appearing urethra with no masses, tenderness or lesions              Bartholins and Skenes: normal                 Vagina: normal appearing vagina with normal color and discharge, no lesions              Cervix: {CHL AMB PHY EX CERVIX NORM DEFAULT:[id number]::"no lesions"}               Bimanual Exam:  Uterus:  {CHL AMB PHY EX UTERUS NORM DEFAULT:[phone]::"normal size, contour, position, consistency, mobility, non-tender"}              Adnexa: {CHL AMB PHY EX ADNEXA NO MASS [phone]::"no mass, fullness, tenderness"}               Rectovaginal: Confirms               Anus:  normal sphincter tone, no lesions  *** chaperoned for the exam.  A:  Well Woman with normal exam  P:

## 2019-08-10 ENCOUNTER — Ambulatory Visit: Payer: No Typology Code available for payment source | Admitting: Obstetrics and Gynecology

## 2019-08-23 ENCOUNTER — Ambulatory Visit: Payer: No Typology Code available for payment source | Admitting: Obstetrics and Gynecology

## 2019-10-10 ENCOUNTER — Ambulatory Visit: Payer: No Typology Code available for payment source | Admitting: Obstetrics and Gynecology

## 2019-10-11 IMAGING — DX DG HAND COMPLETE 3+V*L*
3 series · 3 of 3 positions shown · non-contrast
Comparison: None.

CLINICAL DATA: Chronic left hand pain without known injury.

EXAM:
LEFT HAND - COMPLETE 3+ VIEW

[hand pa]
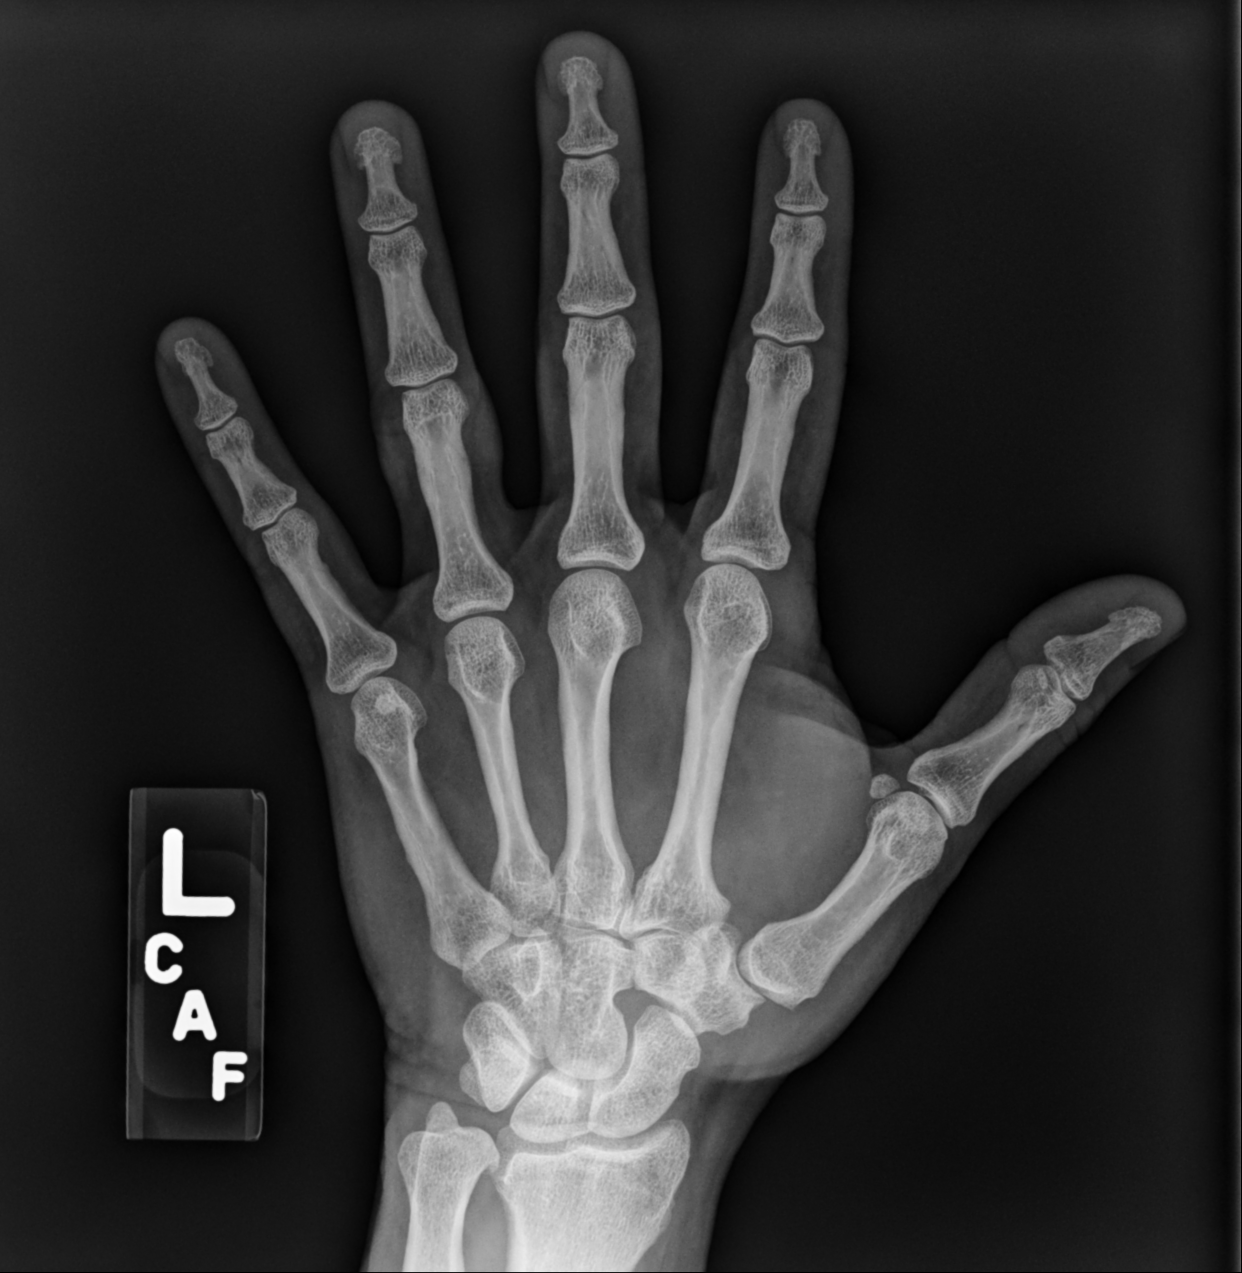

[hand oblique]
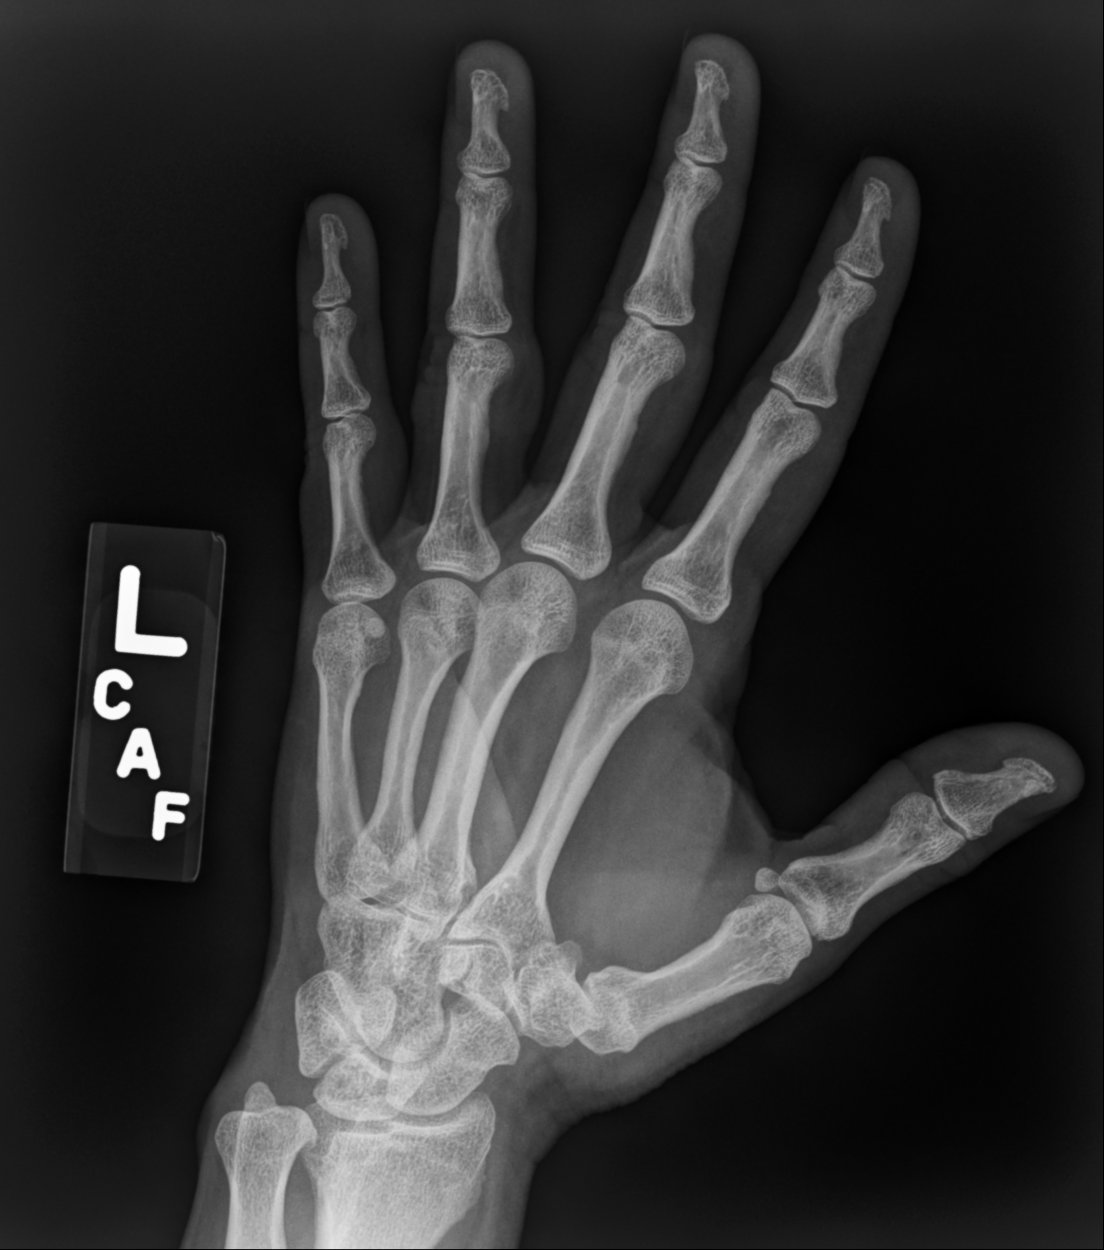

[hand lat]
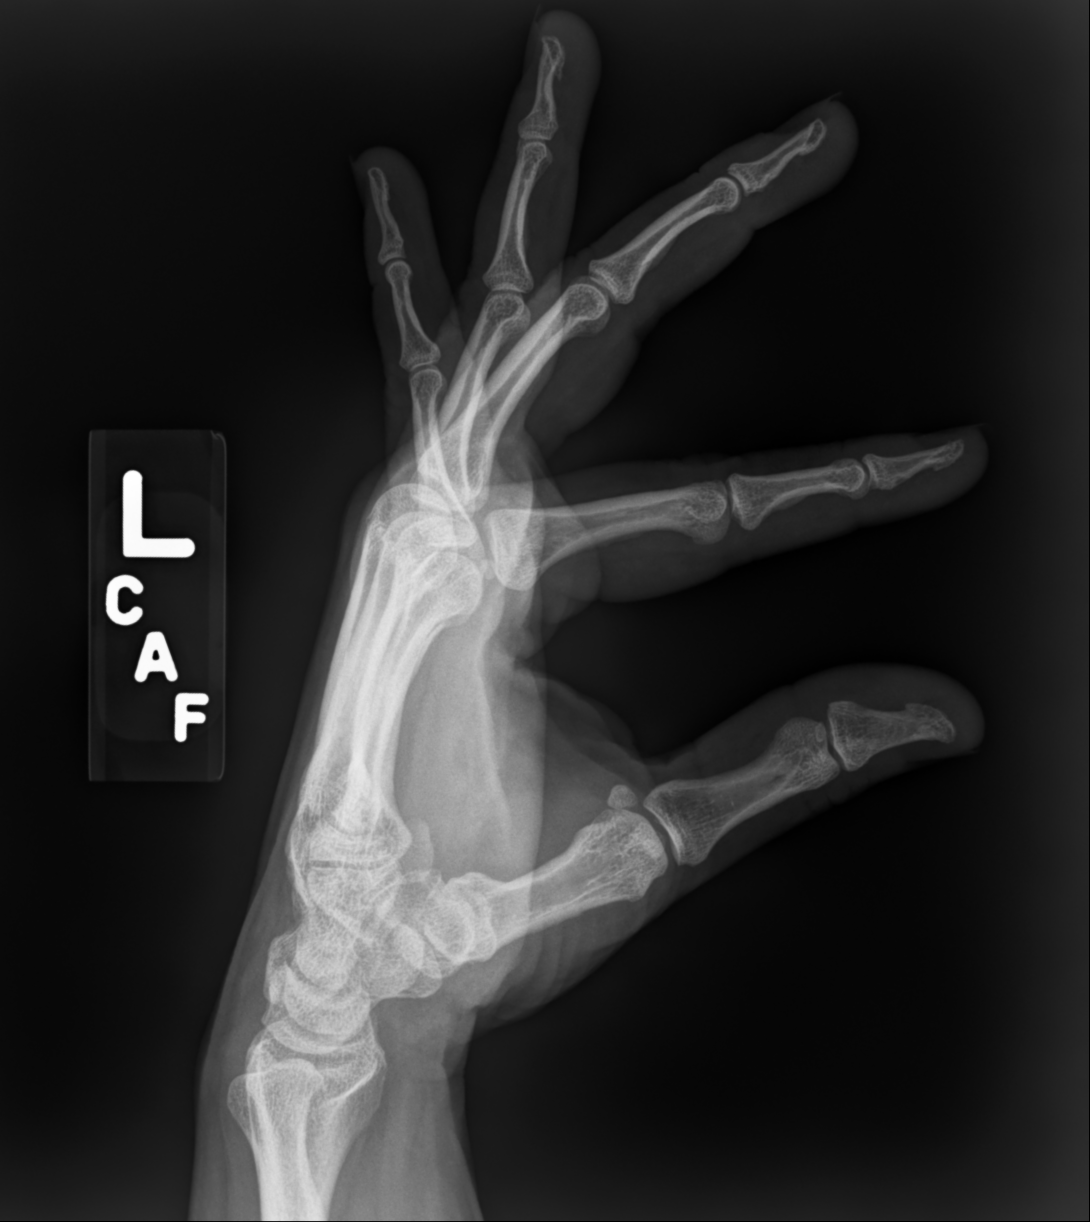

[3 of 3 positions shown; findings below may reference images not displayed]

FINDINGS: There is no evidence of fracture or dislocation. There is no
evidence of arthropathy or other focal bone abnormality. Soft
tissues are unremarkable.
IMPRESSION: Normal left hand.

## 2019-11-14 ENCOUNTER — Encounter: Payer: Self-pay | Admitting: Family Medicine

## 2019-12-22 ENCOUNTER — Other Ambulatory Visit: Payer: Self-pay

## 2019-12-22 ENCOUNTER — Ambulatory Visit (INDEPENDENT_AMBULATORY_CARE_PROVIDER_SITE_OTHER): Payer: No Typology Code available for payment source | Admitting: Family Medicine

## 2019-12-22 ENCOUNTER — Encounter: Payer: Self-pay | Admitting: Family Medicine

## 2019-12-22 VITALS — BP 113/73 | HR 74 | Temp 98.0°F | Ht 61.0 in | Wt 147.0 lb

## 2019-12-22 DIAGNOSIS — Z0000 Encounter for general adult medical examination without abnormal findings: Secondary | ICD-10-CM

## 2019-12-22 DIAGNOSIS — Z1159 Encounter for screening for other viral diseases: Secondary | ICD-10-CM

## 2019-12-22 NOTE — Progress Notes (Signed)
Patient: Morgan Wolf MRN: [id number] DOB: [birth year] PCP: Orma Flaming, MD     Subjective:  Chief Complaint  Patient presents with  . Annual Exam    HPI: The patient is a 49 y.o. female who presents today for annual exam. She denies any changes to past medical history. There have been no recent hospitalizations. They are following a well balanced diet and exercise plan. She does strength training 3 times weekly, and cardio 2 times weekly. Weight has been stable. She will see GYN next week, and have her Mammo in December. No complaints today. She is still peri-menopausal.   No family history of colon cancer or breast cancer. Paternal grandmother breast cancer.   There is no immunization history on file for this patient.  Colonoscopy: has had in 2001 or 2002.  Mammogram: 02/17/2019 Pap smear: 07/21/2017 Tdap: utd. Had 2020.    Review of Systems  Constitutional: Negative for chills, fatigue and fever.  HENT: Negative for congestion, dental problem, ear pain, hearing loss, sore throat and trouble swallowing.   Eyes: Negative for visual disturbance.  Respiratory: Negative for cough, chest tightness, shortness of breath and wheezing.   Cardiovascular: Negative for chest pain, palpitations and leg swelling.  Gastrointestinal: Negative for abdominal pain, blood in stool, diarrhea, nausea and vomiting.  Endocrine: Negative for cold intolerance, polydipsia, polyphagia and polyuria.  Genitourinary: Negative for dysuria, flank pain, frequency, hematuria and pelvic pain.  Musculoskeletal: Negative for arthralgias.  Skin: Negative for rash.  Neurological: Negative for dizziness and headaches.  Psychiatric/Behavioral: Negative for dysphoric mood and sleep disturbance. The patient is not nervous/anxious.     Allergies Patient is allergic to codeine and sulfa antibiotics.  Past Medical History Patient  has a past medical history of Allergy, Amenorrhea, Dysmenorrhea, Endometriosis,  Hormone disorder, Prolactinoma ([place]), and Stroke ([place]) (12/2009).  Surgical History Patient  has a past surgical history that includes Tonsillectomy and adenoidectomy (2011); Knee arthroscopy w/ ACL reconstruction (11/2010); Cesarean section; Endometrial ablation; Colposcopy; Pelvic laparoscopy; Total laparoscopic hysterectomy with salpingectomy (Bilateral, 08/10/2017); Cystoscopy (N/A, 08/10/2017); and Abdominal hysterectomy.  Family History Pateint's family history includes Alzheimer's disease in her mother; Arthritis in her mother; Breast cancer in her paternal grandmother; Cancer in her maternal grandfather and paternal grandmother; Diabetes in her paternal grandmother; Hyperlipidemia in her father; Hypertension in her mother; Lung cancer in her maternal grandfather.  Social History Patient  reports that she has never smoked. She has never used smokeless tobacco. She reports current alcohol use of about 2.0 - 3.0 standard drinks of alcohol per week. She reports that she does not use drugs.    Objective: Vitals:   12/22/19 1326  BP: 113/73  Pulse: 74  Temp: 98 F (36.7 C)  TempSrc: Temporal  SpO2: 100%  Weight: 147 lb (66.7 kg)  Height: 5' 1"  (1.549 m)    Body mass index is 27.78 kg/m.  Physical Exam Vitals reviewed.  Constitutional:      Appearance: Normal appearance. She is well-developed and normal weight.  HENT:     Head: Normocephalic and atraumatic.     Right Ear: External ear normal.     Left Ear: External ear normal.  Eyes:     Conjunctiva/sclera: Conjunctivae normal.     Pupils: Pupils are equal, round, and reactive to light.  Neck:     Thyroid: No thyromegaly.  Cardiovascular:     Rate and Rhythm: Normal rate and regular rhythm.     Heart sounds: Normal heart sounds. No murmur heard.  Pulmonary:     Effort: Pulmonary effort is normal.     Breath sounds: Normal breath sounds.  Abdominal:     General: Bowel sounds are normal. There is no distension.      Palpations: Abdomen is soft.     Tenderness: There is no abdominal tenderness.  Musculoskeletal:     Cervical back: Normal range of motion and neck supple.  Lymphadenopathy:     Cervical: No cervical adenopathy.  Skin:    General: Skin is warm and dry.     Findings: No rash.  Neurological:     Mental Status: She is alert and oriented to person, place, and time.     Cranial Nerves: No cranial nerve deficit.     Coordination: Coordination normal.     Deep Tendon Reflexes: Reflexes normal.  Psychiatric:        Behavior: Behavior normal.        Assessment/plan: 1. Annual physical exam She is not fasting, but will do labs today. HM reviewed. She is utd on tdap, asked her to find records for me so we can put in. Continue healthy diet/exercise. Doing well. F/u in one year or as needed. Sees Gyn this next week.  Patient counseling [x]    Nutrition: Stressed importance of moderation in sodium/caffeine intake, saturated fat and cholesterol, caloric balance, sufficient intake of fresh fruits, vegetables, fiber, calcium, iron, and 1 mg of folate supplement per day (for females capable of pregnancy).  [x]    Stressed the importance of regular exercise.   []    Substance Abuse: Discussed cessation/primary prevention of tobacco, alcohol, or other drug use; driving or other dangerous activities under the influence; availability of treatment for abuse.   [x]    Injury prevention: Discussed safety belts, safety helmets, smoke detector, smoking near bedding or upholstery.   [x]    Sexuality: Discussed sexually transmitted diseases, partner selection, use of condoms, avoidance of unintended pregnancy  and contraceptive alternatives.  [x]    Dental health: Discussed importance of regular tooth brushing, flossing, and dental visits.  [x]    Health maintenance and immunizations reviewed. Please refer to Health maintenance section.    - CBC with Differential/Platelet; Future - Lipid panel; Future - TSH;  Future - COMPLETE METABOLIC PANEL WITH GFR; Future  2. Encounter for hepatitis C screening test for low risk patient - Hepatitis C antibody   This visit occurred during the SARS-CoV-2 public health emergency.  Safety protocols were in place, including screening questions prior to the visit, additional usage of staff PPE, and extensive cleaning of exam room while observing appropriate contact time as indicated for disinfecting solutions.     Return in about 1 year (around 12/21/2020) for annual .     Orma Flaming, MD [place]  12/22/2019

## 2019-12-22 NOTE — Patient Instructions (Signed)
-labs today.  -utd on HM -check date for tdap  Love seeing you! aw  Preventive Care 47-47 Years Old, Female Preventive care refers to visits with your health care provider and lifestyle choices that can promote health and wellness. This includes:  A yearly physical exam. This may also be called an annual well check.  Regular dental visits and eye exams.  Immunizations.  Screening for certain conditions.  Healthy lifestyle choices, such as eating a healthy diet, getting regular exercise, not using drugs or products that contain nicotine and tobacco, and limiting alcohol use. What can I expect for my preventive care visit? Physical exam Your health care provider will check your:  Height and weight. This may be used to calculate body mass index (BMI), which tells if you are at a healthy weight.  Heart rate and blood pressure.  Skin for abnormal spots. Counseling Your health care provider may ask you questions about your:  Alcohol, tobacco, and drug use.  Emotional well-being.  Home and relationship well-being.  Sexual activity.  Eating habits.  Work and work Statistician.  Method of birth control.  Menstrual cycle.  Pregnancy history. What immunizations do I need?  Influenza (flu) vaccine  This is recommended every year. Tetanus, diphtheria, and pertussis (Tdap) vaccine  You may need a Td booster every 10 years. Varicella (chickenpox) vaccine  You may need this if you have not been vaccinated. Zoster (shingles) vaccine  You may need this after age 19. Measles, mumps, and rubella (MMR) vaccine  You may need at least one dose of MMR if you were born in 1957 or later. You may also need a second dose. Pneumococcal conjugate (PCV13) vaccine  You may need this if you have certain conditions and were not previously vaccinated. Pneumococcal polysaccharide (PPSV23) vaccine  You may need one or two doses if you smoke cigarettes or if you have certain  conditions. Meningococcal conjugate (MenACWY) vaccine  You may need this if you have certain conditions. Hepatitis A vaccine  You may need this if you have certain conditions or if you travel or work in places where you may be exposed to hepatitis A. Hepatitis B vaccine  You may need this if you have certain conditions or if you travel or work in places where you may be exposed to hepatitis B. Haemophilus influenzae type b (Hib) vaccine  You may need this if you have certain conditions. Human papillomavirus (HPV) vaccine  If recommended by your health care provider, you may need three doses over 6 months. You may receive vaccines as individual doses or as more than one vaccine together in one shot (combination vaccines). Talk with your health care provider about the risks and benefits of combination vaccines. What tests do I need? Blood tests  Lipid and cholesterol levels. These may be checked every 5 years, or more frequently if you are over 58 years old.  Hepatitis C test.  Hepatitis B test. Screening  Lung cancer screening. You may have this screening every year starting at age 93 if you have a 30-pack-year history of smoking and currently smoke or have quit within the past 15 years.  Colorectal cancer screening. All adults should have this screening starting at age 65 and continuing until age 96. Your health care provider may recommend screening at age 102 if you are at increased risk. You will have tests every 1-10 years, depending on your results and the type of screening test.  Diabetes screening. This is done by checking your  blood sugar (glucose) after you have not eaten for a while (fasting). You may have this done every 1-3 years.  Mammogram. This may be done every 1-2 years. Talk with your health care provider about when you should start having regular mammograms. This may depend on whether you have a family history of breast cancer.  BRCA-related cancer screening. This  may be done if you have a family history of breast, ovarian, tubal, or peritoneal cancers.  Pelvic exam and Pap test. This may be done every 3 years starting at age 4. Starting at age 37, this may be done every 5 years if you have a Pap test in combination with an HPV test. Other tests  Sexually transmitted disease (STD) testing.  Bone density scan. This is done to screen for osteoporosis. You may have this scan if you are at high risk for osteoporosis. Follow these instructions at home: Eating and drinking  Eat a diet that includes fresh fruits and vegetables, whole grains, lean protein, and low-fat dairy.  Take vitamin and mineral supplements as recommended by your health care provider.  Do not drink alcohol if: ? Your health care provider tells you not to drink. ? You are pregnant, may be pregnant, or are planning to become pregnant.  If you drink alcohol: ? Limit how much you have to 0-1 drink a day. ? Be aware of how much alcohol is in your drink. In the [place] one drink equals one 12 oz bottle of beer (355 mL), one 5 oz glass of wine (148 mL), or one 1 oz glass of hard liquor (44 mL). Lifestyle  Take daily care of your teeth and gums.  Stay active. Exercise for at least 30 minutes on 5 or more days each week.  Do not use any products that contain nicotine or tobacco, such as cigarettes, e-cigarettes, and chewing tobacco. If you need help quitting, ask your health care provider.  If you are sexually active, practice safe sex. Use a condom or other form of birth control (contraception) in order to prevent pregnancy and STIs (sexually transmitted infections).  If told by your health care provider, take low-dose aspirin daily starting at age 45. What's next?  Visit your health care provider once a year for a well check visit.  Ask your health care provider how often you should have your eyes and teeth checked.  Stay up to date on all vaccines. This information is not  intended to replace advice given to you by your health care provider. Make sure you discuss any questions you have with your health care provider. Document Revised: 10/29/2017 Document Reviewed: 10/29/2017 Elsevier Patient Education  2020 [employer].

## 2019-12-26 NOTE — Progress Notes (Signed)
49 y.o. G70P2002 Married White or Caucasian Not Hispanic or Latino female here for annual exam.      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 Diagnosed in 2011 when she had a stroke (no residual issues). Followed by Opthalmology, Endocrinoloty and her Neurosurgeon. Getting yearly MRI's H/O endometrial ablation. H/O TLH/BS in 6/19 for AUB and dysmenorrhea. Just occasional vasomotor issues. No dyspareunia.   Patient's last menstrual period was 07/25/2017 (exact date).          Sexually active: Yes.    The current method of family planning is status post hysterectomy.    Exercising: Yes.    Cardio and weight  Smoker:  no  Health Maintenance: Pap: 07/21/2017 WNL, 02/2016 WNL per patient History of abnormal Pap: Yes- years ago- colposcopy neg  MMG:  02/18/19 density B Bi-rads 1 neg  BMD:02/2016 normal per patient    Colonoscopy: 2002 normal per patient, was told routine f/u  TDaP:  Early 2020  Gardasil: none    reports that she has never smoked. She has never used smokeless tobacco. She reports current alcohol use of about 2.0 - 3.0 standard drinks of alcohol per week. She reports that she does not use drugs. 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  Husband works for IT at [employer].  Past Medical History:  Diagnosis Date  . Allergy   . Amenorrhea   . Dysmenorrhea   . Endometriosis   . Hormone disorder   . Prolactinoma ([place])    Followed by Dr Chalmers Cater  . Stroke [facility]) 12/2009    Past Surgical History:  Procedure Laterality Date  . ABDOMINAL HYSTERECTOMY    . CESAREAN SECTION    . COLPOSCOPY    . CYSTOSCOPY N/A 08/10/2017   Procedure: CYSTOSCOPY;  Surgeon: Salvadore Dom, MD;  Location: [facility];  Service: Gynecology;  Laterality: N/A;  . ENDOMETRIAL ABLATION    . KNEE ARTHROSCOPY W/ ACL RECONSTRUCTION  11/2010  . PELVIC LAPAROSCOPY     Endometriosis  . TONSILLECTOMY AND ADENOIDECTOMY  2011  . TOTAL LAPAROSCOPIC HYSTERECTOMY WITH  SALPINGECTOMY Bilateral 08/10/2017   Procedure: TOTAL LAPAROSCOPIC HYSTERECTOMY WITH SALPINGECTOMY;  Surgeon: Salvadore Dom, MD;  Location: [facility];  Service: Gynecology;  Laterality: Bilateral;  extended recovery bed    Current Outpatient Medications  Medication Sig Dispense Refill  . aspirin 325 MG tablet Take 325 mg by mouth daily.    . baclofen (LIORESAL) 10 MG tablet Take 1 tablet (10 mg total) by mouth 3 (three) times daily. 30 each 0  . Multiple Vitamins-Minerals (MULTIVITAMIN WITH MINERALS) tablet Take 1 tablet by mouth daily.     No current facility-administered medications for this visit.    Family History  Problem Relation Age of Onset  . Arthritis Mother   . Hypertension Mother   . Alzheimer's disease Mother   . Hyperlipidemia Father   . Cancer Maternal Grandfather   . Lung cancer Maternal Grandfather   . Cancer Paternal Grandmother   . Diabetes Paternal Grandmother   . Breast cancer Paternal Grandmother     Review of Systems  All other systems reviewed and are negative.   Exam:   BP 108/60   Pulse 87   Ht 5' 1"  (1.549 m)   Wt 147 lb (66.7 kg)   LMP 07/25/2017 (Exact Date)   SpO2 100%   BMI 27.78 kg/m   Weight change: @WEIGHTCHANGE @ Height:   Height: 5' 1"  (154.9 cm)  Ht Readings from Last 3 Encounters:  12/29/19 5' 1"  (1.549 m)  12/22/19 5' 1"  (1.549 m)  11/10/18 5' 1"  (1.549 m)    General appearance: alert, cooperative and appears stated age Head: Normocephalic, without obvious abnormality, atraumatic Neck: no adenopathy, supple, symmetrical, trachea midline and thyroid normal to inspection and palpation Lungs: clear to auscultation bilaterally Cardiovascular: regular rate and rhythm Breasts: normal appearance, no masses or tenderness Abdomen: soft, non-tender; non distended,  no masses,  no organomegaly Extremities: extremities normal, atraumatic, no cyanosis or edema Skin: Skin color, texture, turgor normal. No rashes or  lesions Lymph nodes: Cervical, supraclavicular, and axillary nodes normal. No abnormal inguinal nodes palpated Neurologic: Grossly normal   Pelvic: External genitalia:  no lesions              Urethra:  normal appearing urethra with no masses, tenderness or lesions              Bartholins and Skenes: normal                 Vagina: normal appearing vagina with normal color and discharge, no lesions              Cervix: absent               Bimanual Exam:  Uterus:  uterus absent              Adnexa: no mass, fullness, tenderness               Rectovaginal: Confirms               Anus:  normal sphincter tone, no lesions  Terence Lux chaperoned for the exam.  A:  Well Woman with normal exam  H/O hysterectomy  H/O macroadenoma, followed by Dr Chalmers Cater  H/O CVA, followed by Neurosugery  P:   No pap needed  Labs with primary  Mammogram in 12/21  Discussed breast self exam  Discussed calcium and vit D intake

## 2019-12-29 ENCOUNTER — Other Ambulatory Visit: Payer: Self-pay

## 2019-12-29 ENCOUNTER — Ambulatory Visit (INDEPENDENT_AMBULATORY_CARE_PROVIDER_SITE_OTHER): Payer: No Typology Code available for payment source | Admitting: Obstetrics and Gynecology

## 2019-12-29 ENCOUNTER — Encounter: Payer: Self-pay | Admitting: Obstetrics and Gynecology

## 2019-12-29 VITALS — BP 108/60 | HR 87 | Ht 61.0 in | Wt 147.0 lb

## 2019-12-29 DIAGNOSIS — Z0000 Encounter for general adult medical examination without abnormal findings: Secondary | ICD-10-CM

## 2019-12-29 DIAGNOSIS — E559 Vitamin D deficiency, unspecified: Secondary | ICD-10-CM

## 2019-12-29 DIAGNOSIS — Z01419 Encounter for gynecological examination (general) (routine) without abnormal findings: Secondary | ICD-10-CM

## 2019-12-29 NOTE — Patient Instructions (Signed)
EXERCISE AND DIET:  We recommended that you start or continue a regular exercise program for good health. Regular exercise means any activity that makes your heart beat faster and makes you sweat.  We recommend exercising at least 30 minutes per day at least 3 days a week, preferably 4 or 5.  We also recommend a diet low in fat and sugar.  Inactivity, poor dietary choices and obesity can cause diabetes, heart attack, stroke, and kidney damage, among others.    ALCOHOL AND SMOKING:  Women should limit their alcohol intake to no more than 7 drinks/beers/glasses of wine (combined, not each!) per week. Moderation of alcohol intake to this level decreases your risk of breast cancer and liver damage. And of course, no recreational drugs are part of a healthy lifestyle.  And absolutely no smoking or even second hand smoke. Most people know smoking can cause heart and lung diseases, but did you know it also contributes to weakening of your bones? Aging of your skin?  Yellowing of your teeth and nails?  CALCIUM AND VITAMIN D:  Adequate intake of calcium and Vitamin D are recommended.  The recommendations for exact amounts of these supplements seem to change often, but generally speaking 1,000 mg of calcium (between diet and supplement) and 800 units of Vitamin D per day seems prudent. Certain women may benefit from higher intake of Vitamin D.  If you are among these women, your doctor will have told you during your visit.    PAP SMEARS:  Pap smears, to check for cervical cancer or precancers,  have traditionally been done yearly, although recent scientific advances have shown that most women can have pap smears less often.  However, every woman still should have a physical exam from her gynecologist every year. It will include a breast check, inspection of the vulva and vagina to check for abnormal growths or skin changes, a visual exam of the cervix, and then an exam to evaluate the size and shape of the uterus and  ovaries.  And after 47 years of age, a rectal exam is indicated to check for rectal cancers. We will also provide age appropriate advice regarding health maintenance, like when you should have certain vaccines, screening for sexually transmitted diseases, bone density testing, colonoscopy, mammograms, etc.   MAMMOGRAMS:  All women over 19 years old should have a yearly mammogram. Many facilities now offer a "3D" mammogram, which may cost around $50 extra out of pocket. If possible,  we recommend you accept the option to have the 3D mammogram performed.  It both reduces the number of women who will be called back for extra views which then turn out to be normal, and it is better than the routine mammogram at detecting truly abnormal areas.    COLON CANCER SCREENING: Now recommend starting at age 47. At this time colonoscopy is not covered for routine screening until 50. There are take home tests that can be done between 45-49.   COLONOSCOPY:  Colonoscopy to screen for colon cancer is recommended for all women at age 65.  We know, you hate the idea of the prep.  We agree, BUT, having colon cancer and not knowing it is worse!!  Colon cancer so often starts as a polyp that can be seen and removed at colonscopy, which can quite literally save your life!  And if your first colonoscopy is normal and you have no family history of colon cancer, most women don't have to have it again for  10 years.  Once every ten years, you can do something that may end up saving your life, right?  We will be happy to help you get it scheduled when you are ready.  Be sure to check your insurance coverage so you understand how much it will cost.  It may be covered as a preventative service at no cost, but you should check your particular policy.      Breast Self-Awareness Breast self-awareness means being familiar with how your breasts look and feel. It involves checking your breasts regularly and reporting any changes to your  health care provider. Practicing breast self-awareness is important. A change in your breasts can be a sign of a serious medical problem. Being familiar with how your breasts look and feel allows you to find any problems early, when treatment is more likely to be successful. All women should practice breast self-awareness, including women who have had breast implants. How to do a breast self-exam One way to learn what is normal for your breasts and whether your breasts are changing is to do a breast self-exam. To do a breast self-exam: Look for Changes  1. Remove all the clothing above your waist. 2. Stand in front of a mirror in a room with good lighting. 3. Put your hands on your hips. 4. Push your hands firmly downward. 5. Compare your breasts in the mirror. Look for differences between them (asymmetry), such as: ? Differences in shape. ? Differences in size. ? Puckers, dips, and bumps in one breast and not the other. 6. Look at each breast for changes in your skin, such as: ? Redness. ? Scaly areas. 7. Look for changes in your nipples, such as: ? Discharge. ? Bleeding. ? Dimpling. ? Redness. ? A change in position. Feel for Changes Carefully feel your breasts for lumps and changes. It is best to do this while lying on your back on the floor and again while sitting or standing in the shower or tub with soapy water on your skin. Feel each breast in the following way:  Place the arm on the side of the breast you are examining above your head.  Feel your breast with the other hand.  Start in the nipple area and make  inch (2 cm) overlapping circles to feel your breast. Use the pads of your three middle fingers to do this. Apply light pressure, then medium pressure, then firm pressure. The light pressure will allow you to feel the tissue closest to the skin. The medium pressure will allow you to feel the tissue that is a little deeper. The firm pressure will allow you to feel the tissue  close to the ribs.  Continue the overlapping circles, moving downward over the breast until you feel your ribs below your breast.  Move one finger-width toward the center of the body. Continue to use the  inch (2 cm) overlapping circles to feel your breast as you move slowly up toward your collarbone.  Continue the up and down exam using all three pressures until you reach your armpit.  Write Down What You Find  Write down what is normal for each breast and any changes that you find. Keep a written record with breast changes or normal findings for each breast. By writing this information down, you do not need to depend only on memory for size, tenderness, or location. Write down where you are in your menstrual cycle, if you are still menstruating. If you are having trouble noticing differences   in your breasts, do not get discouraged. With time you will become more familiar with the variations in your breasts and more comfortable with the exam. How often should I examine my breasts? Examine your breasts every month. If you are breastfeeding, the best time to examine your breasts is after a feeding or after using a breast pump. If you menstruate, the best time to examine your breasts is 5-7 days after your period is over. During your period, your breasts are lumpier, and it may be more difficult to notice changes. When should I see my health care provider? See your health care provider if you notice:  A change in shape or size of your breasts or nipples.  A change in the skin of your breast or nipples, such as a reddened or scaly area.  Unusual discharge from your nipples.  A lump or thick area that was not there before.  Pain in your breasts.  Anything that concerns you.  

## 2019-12-30 LAB — CBC
Hematocrit: 41.5 % (ref 34.0–46.6)
Hemoglobin: 13.5 g/dL (ref 11.1–15.9)
MCH: 28.5 pg (ref 26.6–33.0)
MCHC: 32.5 g/dL (ref 31.5–35.7)
MCV: 88 fL (ref 79–97)
Platelets: 254 10*3/uL (ref 150–450)
RBC: 4.73 x10E6/uL (ref 3.77–5.28)
RDW: 12.8 % (ref 11.7–15.4)
WBC: 4.4 10*3/uL (ref 3.4–10.8)

## 2019-12-30 LAB — COMPREHENSIVE METABOLIC PANEL
ALT: 17 IU/L (ref 0–32)
AST: 17 IU/L (ref 0–40)
Albumin/Globulin Ratio: 2.2 (ref 1.2–2.2)
Albumin: 4.4 g/dL (ref 3.8–4.8)
Alkaline Phosphatase: 80 IU/L (ref 44–121)
BUN/Creatinine Ratio: 17 (ref 9–23)
BUN: 13 mg/dL (ref 6–24)
Bilirubin Total: <0.2 mg/dL (ref 0.0–1.2)
CO2: 23 mmol/L (ref 20–29)
Calcium: 8.7 mg/dL (ref 8.7–10.2)
Chloride: 105 mmol/L (ref 96–106)
Creatinine, Ser: 0.78 mg/dL (ref 0.57–1.00)
GFR calc Af Amer: 105 mL/min/{1.73_m2} (ref >59)
GFR calc non Af Amer: 91 mL/min/{1.73_m2} (ref >59)
Globulin, Total: 2 g/dL (ref 1.5–4.5)
Glucose: 92 mg/dL (ref 65–99)
Potassium: 4.3 mmol/L (ref 3.5–5.2)
Sodium: 141 mmol/L (ref 134–144)
Total Protein: 6.4 g/dL (ref 6.0–8.5)

## 2019-12-30 LAB — LIPID PANEL
Chol/HDL Ratio: 3.3 ratio (ref 0.0–4.4)
Cholesterol, Total: 196 mg/dL (ref 100–199)
HDL: 60 mg/dL (ref >39)
LDL Chol Calc (NIH): 127 mg/dL — ABNORMAL HIGH (ref 0–99)
Triglycerides: 49 mg/dL (ref 0–149)
VLDL Cholesterol Cal: 9 mg/dL (ref 5–40)

## 2019-12-30 LAB — VITAMIN D 25 HYDROXY (VIT D DEFICIENCY, FRACTURES): Vit D, 25-Hydroxy: 49 ng/mL (ref 30.0–100.0)

## 2019-12-30 LAB — TSH: TSH: 1.48 u[IU]/mL (ref 0.450–4.500)

## 2020-02-22 ENCOUNTER — Encounter: Payer: Self-pay | Admitting: Family Medicine

## 2020-03-08 ENCOUNTER — Encounter (HOSPITAL_BASED_OUTPATIENT_CLINIC_OR_DEPARTMENT_OTHER): Payer: No Typology Code available for payment source

## 2020-03-20 ENCOUNTER — Other Ambulatory Visit (HOSPITAL_BASED_OUTPATIENT_CLINIC_OR_DEPARTMENT_OTHER): Payer: Self-pay | Admitting: Family Medicine

## 2020-03-20 DIAGNOSIS — Z1231 Encounter for screening mammogram for malignant neoplasm of breast: Secondary | ICD-10-CM

## 2020-03-22 ENCOUNTER — Encounter (HOSPITAL_BASED_OUTPATIENT_CLINIC_OR_DEPARTMENT_OTHER): Payer: No Typology Code available for payment source

## 2020-03-22 DIAGNOSIS — Z1231 Encounter for screening mammogram for malignant neoplasm of breast: Secondary | ICD-10-CM

## 2020-03-29 ENCOUNTER — Other Ambulatory Visit: Payer: Self-pay | Admitting: Family Medicine

## 2020-03-29 MED ORDER — FLUOXETINE HCL 10 MG PO CAPS: 10.0000 mg | ORAL_CAPSULE | Freq: Every day | ORAL | 0 refills | Status: DC

## 2020-03-29 MED ORDER — FLUOXETINE HCL 20 MG PO CAPS: 20.0000 mg | ORAL_CAPSULE | Freq: Every day | ORAL | 0 refills | Status: DC

## 2020-03-29 MED ORDER — BUDESONIDE 1 MG/2ML IN SUSP: 1.0000 mg | Freq: Every day | RESPIRATORY_TRACT | 1 refills | Status: AC

## 2020-03-29 NOTE — Progress Notes (Signed)
Patient has covid.  Starting nutraceutical bundle and sending in pulmicort and prozac.  Discussed precautions.  Orma Flaming, MD [place]

## 2020-04-20 ENCOUNTER — Other Ambulatory Visit: Payer: Self-pay | Admitting: Family Medicine

## 2020-04-25 ENCOUNTER — Other Ambulatory Visit: Payer: Self-pay

## 2020-04-25 ENCOUNTER — Ambulatory Visit (HOSPITAL_BASED_OUTPATIENT_CLINIC_OR_DEPARTMENT_OTHER)
Admission: RE | Admit: 2020-04-25 | Discharge: 2020-04-25 | Disposition: A | Discharge status: Home / Self Care | Payer: No Typology Code available for payment source | Source: Ambulatory Visit | Attending: Family Medicine | Admitting: Family Medicine

## 2020-04-25 DIAGNOSIS — Z1231 Encounter for screening mammogram for malignant neoplasm of breast: Secondary | ICD-10-CM | POA: Diagnosis present

## 2020-07-17 ENCOUNTER — Other Ambulatory Visit: Payer: Self-pay

## 2020-07-17 ENCOUNTER — Encounter: Payer: Self-pay | Admitting: Family Medicine

## 2020-07-17 DIAGNOSIS — Z8673 Personal history of transient ischemic attack (TIA), and cerebral infarction without residual deficits: Secondary | ICD-10-CM

## 2020-07-20 ENCOUNTER — Other Ambulatory Visit: Payer: Self-pay

## 2020-07-20 DIAGNOSIS — D497 Neoplasm of unspecified behavior of endocrine glands and other parts of nervous system: Secondary | ICD-10-CM

## 2020-07-20 NOTE — Addendum Note (Signed)
Addended by: Jerrel Ivory D on: 07/20/2020 03:23 PM   Modules accepted: Orders

## 2020-07-20 NOTE — Addendum Note (Signed)
Addended by: Jerrel Ivory D on: 07/20/2020 03:07 PM   Modules accepted: Orders

## 2020-08-16 ENCOUNTER — Other Ambulatory Visit: Payer: Self-pay | Admitting: Neurosurgery

## 2020-08-16 DIAGNOSIS — E236 Other disorders of pituitary gland: Secondary | ICD-10-CM

## 2020-09-13 ENCOUNTER — Other Ambulatory Visit: Payer: Self-pay

## 2020-09-13 ENCOUNTER — Ambulatory Visit
Admission: RE | Admit: 2020-09-13 | Discharge: 2020-09-13 | Disposition: A | Discharge status: Home / Self Care | Payer: No Typology Code available for payment source | Source: Ambulatory Visit | Attending: Neurosurgery | Admitting: Neurosurgery

## 2020-09-13 DIAGNOSIS — E236 Other disorders of pituitary gland: Secondary | ICD-10-CM

## 2020-09-13 MED ORDER — GADOBENATE DIMEGLUMINE 529 MG/ML IV SOLN
7.0000 mL | Freq: Once | INTRAVENOUS | Status: AC | PRN
  Administered 2020-09-13: 7 mL via INTRAVENOUS

## 2021-01-07 NOTE — Progress Notes (Signed)
50 y.o. G91P2002 Married White or Caucasian Not Hispanic or Latino female here for annual exam.   She has mild and tolerable vasomotor symptoms. Some vaginal dryness, helped with lubricant.  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 Diagnosed in 2011 when she had a stroke (no residual issues). Followed by Opthalmology, Endocrinoloty and her Neurosurgeon. Getting yearly MRI's. H/O endometrial ablation. H/O TLH/BS in 6/19 for AUB and dysmenorrhea.    Patient's last menstrual period was 07/25/2017 (exact date).          Sexually active: Yes.    The current method of family planning is status post hysterectomy.    Exercising: Yes.     Walking and weight  Smoker:  no  Health Maintenance: Pap:  07/21/2017- WNL, HPV- neg  History of abnormal Pap:  yes- years ago- colposcopy neg  MMG:  04/25/20- birads 1  BMD: 5-6 years, normal   Colonoscopy: no Cologuard negative 09/24/18 TDaP:  04/19/2018 Gardasil: n/a   reports that she has never smoked. She has never used smokeless tobacco. She reports current alcohol use of about 2.0 - 3.0 standard drinks per week. She reports that she does not use drugs. Homemaker, sons are 69 and 90.  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 Husband works for IT at [employer].  Past Medical History:  Diagnosis Date   Allergy    Amenorrhea    Dysmenorrhea    Endometriosis    Hormone disorder    Prolactinoma ([place])    Followed by Dr Chalmers Cater   Stroke [facility]) 12/2009    Past Surgical History:  Procedure Laterality Date   ABDOMINAL HYSTERECTOMY     CESAREAN SECTION     COLPOSCOPY     CYSTOSCOPY N/A 08/10/2017   Procedure: CYSTOSCOPY;  Surgeon: Salvadore Dom, MD;  Location: [facility];  Service: Gynecology;  Laterality: N/A;   ENDOMETRIAL ABLATION     KNEE ARTHROSCOPY W/ ACL RECONSTRUCTION  11/2010   PELVIC LAPAROSCOPY     Endometriosis   TONSILLECTOMY AND ADENOIDECTOMY  2011    TOTAL LAPAROSCOPIC HYSTERECTOMY WITH SALPINGECTOMY Bilateral 08/10/2017   Procedure: TOTAL LAPAROSCOPIC HYSTERECTOMY WITH SALPINGECTOMY;  Surgeon: Salvadore Dom, MD;  Location: [facility];  Service: Gynecology;  Laterality: Bilateral;  extended recovery bed    Current Outpatient Medications  Medication Sig Dispense Refill   aspirin 325 MG tablet Take 325 mg by mouth daily.     baclofen (LIORESAL) 10 MG tablet Take 1 tablet (10 mg total) by mouth 3 (three) times daily. 30 each 0   budesonide (PULMICORT) 1 MG/2ML nebulizer solution Take 2 mLs (1 mg total) by nebulization daily. 60 mL 1   FLUoxetine (PROZAC) 10 MG capsule Take 1 capsule (10 mg total) by mouth daily. (total of 73m/day) 10 capsule 0   FLUoxetine (PROZAC) 20 MG capsule Take 1 capsule (20 mg total) by mouth daily. 10 capsule 0   Multiple Vitamins-Minerals (MULTIVITAMIN WITH MINERALS) tablet Take 1 tablet by mouth daily.     No current facility-administered medications for this visit.    Family History  Problem Relation Age of Onset   Arthritis Mother    Hypertension Mother    Alzheimer's disease Mother    Hyperlipidemia Father    Cancer Maternal Grandfather    Lung cancer Maternal Grandfather    Cancer Paternal Grandmother    Diabetes Paternal Grandmother    Breast cancer Paternal Grandmother  Review of Systems  All other systems reviewed and are negative.  Exam:   LMP 07/25/2017 (Exact Date)   Weight change: @WEIGHTCHANGE @ Height:      Ht Readings from Last 3 Encounters:  12/29/19 5' 1"  (1.549 m)  12/22/19 5' 1"  (1.549 m)  11/10/18 5' 1"  (1.549 m)    General appearance: alert, cooperative and appears stated age Head: Normocephalic, without obvious abnormality, atraumatic Neck: no adenopathy, supple, symmetrical, trachea midline and thyroid normal to inspection and palpation Lungs: clear to auscultation bilaterally Cardiovascular: regular rate and rhythm Breasts: normal appearance, no  masses or tenderness Abdomen: soft, non-tender; non distended,  no masses,  no organomegaly Extremities: extremities normal, atraumatic, no cyanosis or edema Skin: Skin color, texture, turgor normal. No rashes or lesions Lymph nodes: Cervical, supraclavicular, and axillary nodes normal. No abnormal inguinal nodes palpated Neurologic: Grossly normal   Pelvic: External genitalia:  no lesions              Urethra:  normal appearing urethra with no masses, tenderness or lesions              Bartholins and Skenes: normal                 Vagina: well estrogenized appearing vagina with normal color and discharge, no lesions              Cervix: absent               Bimanual Exam:  Uterus:  uterus absent              Adnexa: no mass, fullness, tenderness               Rectovaginal: Confirms               Anus:  normal sphincter tone, no lesions  Gae Dry chaperoned for the exam.  1. Well woman exam Discussed breast self exam Discussed calcium and vit D intake Mammogram in 2/23 No pap needed  2. Pituitary tumor Followed by Endocrinology, Ophthalmology and Neurosurgery  3. History of CVA (cerebrovascular accident) Followed by Neurosurgery  4. Status post laparoscopic hysterectomy   5. Colon cancer screening - Cologuard in 7/23

## 2021-01-10 ENCOUNTER — Ambulatory Visit (INDEPENDENT_AMBULATORY_CARE_PROVIDER_SITE_OTHER): Payer: No Typology Code available for payment source | Admitting: Obstetrics and Gynecology

## 2021-01-10 ENCOUNTER — Encounter: Payer: Self-pay | Admitting: Obstetrics and Gynecology

## 2021-01-10 ENCOUNTER — Other Ambulatory Visit: Payer: Self-pay

## 2021-01-10 VITALS — BP 110/60 | HR 76 | Ht 60.5 in | Wt 146.0 lb

## 2021-01-10 DIAGNOSIS — Z90710 Acquired absence of both cervix and uterus: Secondary | ICD-10-CM

## 2021-01-10 DIAGNOSIS — Z01419 Encounter for gynecological examination (general) (routine) without abnormal findings: Secondary | ICD-10-CM | POA: Diagnosis not present

## 2021-01-10 DIAGNOSIS — Z780 Asymptomatic menopausal state: Secondary | ICD-10-CM | POA: Insufficient documentation

## 2021-01-10 DIAGNOSIS — Z8673 Personal history of transient ischemic attack (TIA), and cerebral infarction without residual deficits: Secondary | ICD-10-CM | POA: Diagnosis not present

## 2021-01-10 DIAGNOSIS — Z1211 Encounter for screening for malignant neoplasm of colon: Secondary | ICD-10-CM

## 2021-01-10 DIAGNOSIS — D497 Neoplasm of unspecified behavior of endocrine glands and other parts of nervous system: Secondary | ICD-10-CM

## 2021-01-10 DIAGNOSIS — E221 Hyperprolactinemia: Secondary | ICD-10-CM | POA: Insufficient documentation

## 2021-01-10 NOTE — Patient Instructions (Signed)
Try uberlube for vaginal lubrication.  EXERCISE   We recommended that you start or continue a regular exercise program for good health. Physical activity is anything that gets your body moving, some is better than none. The CDC recommends 150 minutes per week of Moderate-Intensity Aerobic Activity and 2 or more days of Muscle Strengthening Activity.  Benefits of exercise are limitless: helps weight loss/weight maintenance, improves mood and energy, helps with depression and anxiety, improves sleep, tones and strengthens muscles, improves balance, improves bone density, protects from chronic conditions such as heart disease, high blood pressure and diabetes and so much more. To learn more visit: WhyNotPoker.uy  DIET: Good nutrition starts with a healthy diet of fruits, vegetables, whole grains, and lean protein sources. Drink plenty of water for hydration. Minimize empty calories, sodium, sweets. For more information about dietary recommendations visit: GeekRegister.com.ee and http://schaefer-mitchell.com/  ALCOHOL:  Women should limit their alcohol intake to no more than 7 drinks/beers/glasses of wine (combined, not each!) per week. Moderation of alcohol intake to this level decreases your risk of breast cancer and liver damage.  If you are concerned that you may have a problem, or your friends have told you they are concerned about your drinking, there are many resources to help. A well-known program that is free, effective, and available to all people all over the nation is Alcoholics Anonymous.  Check out this site to learn more: BlockTaxes.se   CALCIUM AND VITAMIN D:  Adequate intake of calcium and Vitamin D are recommended for bone health.  You should be getting between 1000-1200 mg of calcium and 800 units of Vitamin D daily between diet and supplements  PAP SMEARS:  Pap smears, to check for cervical cancer  or precancers,  have traditionally been done yearly, scientific advances have shown that most women can have pap smears less often.  However, every woman still should have a physical exam from her gynecologist every year. It will include a breast check, inspection of the vulva and vagina to check for abnormal growths or skin changes, a visual exam of the cervix, and then an exam to evaluate the size and shape of the uterus and ovaries. We will also provide age appropriate advice regarding health maintenance, like when you should have certain vaccines, screening for sexually transmitted diseases, bone density testing, colonoscopy, mammograms, etc.   MAMMOGRAMS:  All women over 99 years old should have a routine mammogram.   COLON CANCER SCREENING: Now recommend starting at age 61. At this time colonoscopy is not covered for routine screening until 50. There are take home tests that can be done between 45-49.   COLONOSCOPY:  Colonoscopy to screen for colon cancer is recommended for all women at age 70.  We know, you hate the idea of the prep.  We agree, BUT, having colon cancer and not knowing it is worse!!  Colon cancer so often starts as a polyp that can be seen and removed at colonscopy, which can quite literally save your life!  And if your first colonoscopy is normal and you have no family history of colon cancer, most women don't have to have it again for 10 years.  Once every ten years, you can do something that may end up saving your life, right?  We will be happy to help you get it scheduled when you are ready.  Be sure to check your insurance coverage so you understand how much it will cost.  It may be covered as a preventative service at  no cost, but you should check your particular policy.      Breast Self-Awareness Breast self-awareness means being familiar with how your breasts look and feel. It involves checking your breasts regularly and reporting any changes to your health care  provider. Practicing breast self-awareness is important. A change in your breasts can be a sign of a serious medical problem. Being familiar with how your breasts look and feel allows you to find any problems early, when treatment is more likely to be successful. All women should practice breast self-awareness, including women who have had breast implants. How to do a breast self-exam One way to learn what is normal for your breasts and whether your breasts are changing is to do a breast self-exam. To do a breast self-exam: Look for Changes  Remove all the clothing above your waist. Stand in front of a mirror in a room with good lighting. Put your hands on your hips. Push your hands firmly downward. Compare your breasts in the mirror. Look for differences between them (asymmetry), such as: Differences in shape. Differences in size. Puckers, dips, and bumps in one breast and not the other. Look at each breast for changes in your skin, such as: Redness. Scaly areas. Look for changes in your nipples, such as: Discharge. Bleeding. Dimpling. Redness. A change in position. Feel for Changes Carefully feel your breasts for lumps and changes. It is best to do this while lying on your back on the floor and again while sitting or standing in the shower or tub with soapy water on your skin. Feel each breast in the following way: Place the arm on the side of the breast you are examining above your head. Feel your breast with the other hand. Start in the nipple area and make  inch (2 cm) overlapping circles to feel your breast. Use the pads of your three middle fingers to do this. Apply light pressure, then medium pressure, then firm pressure. The light pressure will allow you to feel the tissue closest to the skin. The medium pressure will allow you to feel the tissue that is a little deeper. The firm pressure will allow you to feel the tissue close to the ribs. Continue the overlapping circles,  moving downward over the breast until you feel your ribs below your breast. Move one finger-width toward the center of the body. Continue to use the  inch (2 cm) overlapping circles to feel your breast as you move slowly up toward your collarbone. Continue the up and down exam using all three pressures until you reach your armpit.  Write Down What You Find  Write down what is normal for each breast and any changes that you find. Keep a written record with breast changes or normal findings for each breast. By writing this information down, you do not need to depend only on memory for size, tenderness, or location. Write down where you are in your menstrual cycle, if you are still menstruating. If you are having trouble noticing differences in your breasts, do not get discouraged. With time you will become more familiar with the variations in your breasts and more comfortable with the exam. How often should I examine my breasts? Examine your breasts every month. If you are breastfeeding, the best time to examine your breasts is after a feeding or after using a breast pump. If you menstruate, the best time to examine your breasts is 5-7 days after your period is over. During your period, your breasts are  lumpier, and it may be more difficult to notice changes. When should I see my health care provider? See your health care provider if you notice: A change in shape or size of your breasts or nipples. A change in the skin of your breast or nipples, such as a reddened or scaly area. Unusual discharge from your nipples. A lump or thick area that was not there before. Pain in your breasts. Anything that concerns you.Marland Kitchen

## 2021-02-12 ENCOUNTER — Other Ambulatory Visit (HOSPITAL_COMMUNITY): Payer: Self-pay

## 2021-02-12 MED ORDER — ALBUTEROL SULFATE HFA 108 (90 BASE) MCG/ACT IN AERS
INHALATION_SPRAY | RESPIRATORY_TRACT | 1 refills | Status: AC
  Filled 2021-02-12: qty 18, 30d supply, fill #0

## 2021-02-13 ENCOUNTER — Other Ambulatory Visit (HOSPITAL_COMMUNITY): Payer: Self-pay

## 2021-04-19 ENCOUNTER — Other Ambulatory Visit (HOSPITAL_BASED_OUTPATIENT_CLINIC_OR_DEPARTMENT_OTHER): Payer: Self-pay | Admitting: Family Medicine

## 2021-04-19 ENCOUNTER — Other Ambulatory Visit (HOSPITAL_BASED_OUTPATIENT_CLINIC_OR_DEPARTMENT_OTHER): Payer: Self-pay | Admitting: Obstetrics and Gynecology

## 2021-04-19 DIAGNOSIS — Z1231 Encounter for screening mammogram for malignant neoplasm of breast: Secondary | ICD-10-CM

## 2021-04-30 ENCOUNTER — Ambulatory Visit (HOSPITAL_BASED_OUTPATIENT_CLINIC_OR_DEPARTMENT_OTHER)
Admission: RE | Admit: 2021-04-30 | Discharge: 2021-04-30 | Disposition: A | Discharge status: Home / Self Care | Payer: No Typology Code available for payment source | Source: Ambulatory Visit | Attending: Obstetrics and Gynecology | Admitting: Obstetrics and Gynecology

## 2021-04-30 ENCOUNTER — Encounter (HOSPITAL_BASED_OUTPATIENT_CLINIC_OR_DEPARTMENT_OTHER): Payer: Self-pay | Admitting: Radiology

## 2021-04-30 ENCOUNTER — Other Ambulatory Visit: Payer: Self-pay

## 2021-04-30 DIAGNOSIS — Z1231 Encounter for screening mammogram for malignant neoplasm of breast: Secondary | ICD-10-CM | POA: Insufficient documentation

## 2021-05-02 ENCOUNTER — Other Ambulatory Visit: Payer: Self-pay | Admitting: Obstetrics and Gynecology

## 2021-05-02 DIAGNOSIS — R928 Other abnormal and inconclusive findings on diagnostic imaging of breast: Secondary | ICD-10-CM

## 2021-05-09 ENCOUNTER — Ambulatory Visit
Admission: RE | Admit: 2021-05-09 | Discharge: 2021-05-09 | Disposition: A | Discharge status: Home / Self Care | Payer: No Typology Code available for payment source | Source: Ambulatory Visit | Attending: Obstetrics and Gynecology | Admitting: Obstetrics and Gynecology

## 2021-05-09 ENCOUNTER — Other Ambulatory Visit: Payer: Self-pay

## 2021-05-09 DIAGNOSIS — R928 Other abnormal and inconclusive findings on diagnostic imaging of breast: Secondary | ICD-10-CM

## 2021-06-25 ENCOUNTER — Encounter: Payer: Self-pay | Admitting: Obstetrics and Gynecology

## 2021-06-25 MED ORDER — DIBUCAINE (PERIANAL) 1 % EX OINT: 1.0000 "application " | TOPICAL_OINTMENT | CUTANEOUS | 1 refills | Status: AC | PRN

## 2021-06-25 MED ORDER — HYDROCORTISONE ACETATE 25 MG RE SUPP: 25.0000 mg | Freq: Two times a day (BID) | RECTAL | 0 refills | Status: AC | PRN

## 2021-06-25 NOTE — Telephone Encounter (Signed)
Dr.Jertson I don't that you prescribed Rx.  ?

## 2021-10-09 LAB — COLOGUARD: COLOGUARD: NEGATIVE

## 2021-11-11 ENCOUNTER — Other Ambulatory Visit: Payer: Self-pay | Admitting: Neurosurgery

## 2021-11-11 DIAGNOSIS — E236 Other disorders of pituitary gland: Secondary | ICD-10-CM

## 2021-12-06 ENCOUNTER — Ambulatory Visit
Admission: RE | Admit: 2021-12-06 | Discharge: 2021-12-06 | Disposition: A | Discharge status: Home / Self Care | Payer: No Typology Code available for payment source | Source: Ambulatory Visit | Attending: Neurosurgery | Admitting: Neurosurgery

## 2021-12-06 DIAGNOSIS — E236 Other disorders of pituitary gland: Secondary | ICD-10-CM

## 2021-12-06 MED ORDER — GADOBENATE DIMEGLUMINE 529 MG/ML IV SOLN
13.0000 mL | Freq: Once | INTRAVENOUS | Status: AC | PRN
  Administered 2021-12-06: 6 mL via INTRAVENOUS

## 2022-01-16 ENCOUNTER — Ambulatory Visit: Payer: No Typology Code available for payment source | Admitting: Obstetrics and Gynecology

## 2022-01-22 NOTE — Progress Notes (Signed)
51 y.o. G61P2002 Married White or Caucasian Not Hispanic or Latino female here for annual exam.    She is having trouble sleeping. She wakes up between 2-4 am and has trouble falling back asleep. Her primary gave her trazadone which didn't help.   Sexually active, no pain with use of lubricant.   She has some hot flashes, some night sweats.  H/O stroke.    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 Diagnosed in 2011 when she had a stroke (no residual issues). Followed by Opthalmology, Endocrinoloty and her Neurosurgeon. Getting yearly MRI's.  H/O endometrial ablation.  H/O TLH/BS in 6/19 for AUB and dysmenorrhea.  No bowel or bladder c/o.   Patient's last menstrual period was 07/25/2017 (exact date).          Sexually active: Yes.    The current method of family planning is status post hysterectomy.    Exercising: Yes.     Walking weights  Smoker:  no  Health Maintenance: Pap: 07/21/2017- WNL, HPV- neg  History of abnormal Pap:  yes- years ago- colposcopy, no surgery on her cervix.  MMG:  05/09/21 bi-rads 2 benign diagnostic  BMD:   n/a Colonoscopy: cologuard 10/01/21 neg  TDaP:  04/19/18 Gardasil: n/a   reports that she has never smoked. She has never used smokeless tobacco. She reports current alcohol use of about 2.0 - 3.0 standard drinks of alcohol per week. She reports that she does not use drugs. 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 Currently working part time in an Ophthalmology office. Her sons are 72 (working) and 29 (junior in [employer]). Husband works in Engineer, technical sales for [employer].  Past Medical History:  Diagnosis Date   Allergy    Amenorrhea    Dysmenorrhea    Endometriosis    Hormone disorder    Prolactinoma ([place])    Followed by Dr Chalmers Cater   Stroke [facility]) 12/2009    Past Surgical History:  Procedure Laterality Date   ABDOMINAL HYSTERECTOMY     CESAREAN SECTION     COLPOSCOPY     CYSTOSCOPY N/A 08/10/2017   Procedure: CYSTOSCOPY;   Surgeon: Salvadore Dom, MD;  Location: [facility];  Service: Gynecology;  Laterality: N/A;   ENDOMETRIAL ABLATION     KNEE ARTHROSCOPY W/ ACL RECONSTRUCTION  11/2010   PELVIC LAPAROSCOPY     Endometriosis   TONSILLECTOMY AND ADENOIDECTOMY  2011   TOTAL LAPAROSCOPIC HYSTERECTOMY WITH SALPINGECTOMY Bilateral 08/10/2017   Procedure: TOTAL LAPAROSCOPIC HYSTERECTOMY WITH SALPINGECTOMY;  Surgeon: Salvadore Dom, MD;  Location: [facility];  Service: Gynecology;  Laterality: Bilateral;  extended recovery bed    Current Outpatient Medications  Medication Sig Dispense Refill   albuterol (VENTOLIN HFA) 108 (90 Base) MCG/ACT inhaler Inhale 1 puff into the lungs every 4 hours as needed for wheezing 18 g 1   aspirin 325 MG tablet 1 tablet     budesonide (PULMICORT) 1 MG/2ML nebulizer solution Take 2 mLs (1 mg total) by nebulization daily. 60 mL 1   Cholecalciferol 50 MCG (2000 UT) TABS 1 tablet Orally Once a day for 30 day(s)     dibucaine (NUPERCAINAL) 1 % OINT Place 1 application. rectally as needed for hemorrhoids. Can use up to 4 x a day. 28 g 1   hydrocortisone (ANUSOL-HC) 25 MG suppository Place 1 suppository (25 mg total) rectally 2 (two) times daily as needed for hemorrhoids or anal itching. [place]  suppository 0   Multiple Vitamins-Minerals (MULTIVITAMIN WITH MINERALS) tablet Take 1 tablet by mouth daily.     traZODone (DESYREL) 50 MG tablet Take 50 mg by mouth at bedtime as needed. (Patient not taking: Reported on 02/03/2022)     No current facility-administered medications for this visit.    Family History  Problem Relation Age of Onset   Arthritis Mother    Hypertension Mother    Alzheimer's disease Mother    Hyperlipidemia Father    Cancer Maternal Grandfather    Lung cancer Maternal Grandfather    Cancer Paternal Grandmother    Diabetes Paternal Grandmother    Breast cancer Paternal Grandmother     Review of Systems  All other systems  reviewed and are negative.   Exam:   BP 110/82   Pulse 88   Ht 5' 0.83" (1.545 m)   Wt 144 lb (65.3 kg)   LMP 07/25/2017 (Exact Date)   SpO2 100%   BMI 27.36 kg/m   Weight change: '@WEIGHTCHANGE'$ @ Height:   Height: 5' 0.83" (154.5 cm)  Ht Readings from Last 3 Encounters:  02/03/22 5' 0.83" (1.545 m)  01/10/21 5' 0.5" (1.537 m)  12/29/19 '5\' 1"'$  (1.549 m)    General appearance: alert, cooperative and appears stated age Head: Normocephalic, without obvious abnormality, atraumatic Neck: no adenopathy, supple, symmetrical, trachea midline and thyroid normal to inspection and palpation Lungs: clear to auscultation bilaterally Cardiovascular: regular rate and rhythm Breasts: normal appearance, no masses or tenderness Abdomen: soft, non-tender; non distended,  no masses,  no organomegaly Extremities: extremities normal, atraumatic, no cyanosis or edema Skin: Skin color, texture, turgor normal. No rashes or lesions Lymph nodes: Cervical, supraclavicular, and axillary nodes normal. No abnormal inguinal nodes palpated Neurologic: Grossly normal   Pelvic: External genitalia:  no lesions              Urethra:  normal appearing urethra with no masses, tenderness or lesions              Bartholins and Skenes: normal                 Vagina: normal appearing vagina with normal color and discharge, no lesions              Cervix: absent               Bimanual Exam:  Uterus:  uterus absent              Adnexa: no mass, fullness, tenderness               Rectovaginal: Confirms               Anus:  normal sphincter tone, no lesions    1. Well woman exam Labs with primary No pap needed Mammogram UTD Colon cancer screening UTD Discussed breast self exam Discussed calcium and vit D intake We discussed sleep

## 2022-02-03 ENCOUNTER — Ambulatory Visit (INDEPENDENT_AMBULATORY_CARE_PROVIDER_SITE_OTHER): Payer: No Typology Code available for payment source | Admitting: Obstetrics and Gynecology

## 2022-02-03 ENCOUNTER — Encounter: Payer: Self-pay | Admitting: Obstetrics and Gynecology

## 2022-02-03 VITALS — BP 110/82 | HR 88 | Ht 60.83 in | Wt 144.0 lb

## 2022-02-03 DIAGNOSIS — Z01419 Encounter for gynecological examination (general) (routine) without abnormal findings: Secondary | ICD-10-CM

## 2022-02-03 NOTE — Patient Instructions (Signed)

## 2022-04-16 ENCOUNTER — Other Ambulatory Visit: Payer: Self-pay | Admitting: *Deleted

## 2022-04-16 ENCOUNTER — Ambulatory Visit: Payer: 59 | Attending: Family Medicine

## 2022-04-16 DIAGNOSIS — R55 Syncope and collapse: Secondary | ICD-10-CM

## 2022-04-16 DIAGNOSIS — R002 Palpitations: Secondary | ICD-10-CM

## 2022-04-16 NOTE — Progress Notes (Unsigned)
Enrolled for [employer] to mail a ZIO XT long term holter monitor to the patients address on file.   DOD to read. 

## 2022-04-19 DIAGNOSIS — R002 Palpitations: Secondary | ICD-10-CM

## 2022-04-19 DIAGNOSIS — R55 Syncope and collapse: Secondary | ICD-10-CM

## 2022-05-05 DIAGNOSIS — R002 Palpitations: Secondary | ICD-10-CM | POA: Diagnosis not present

## 2022-05-05 DIAGNOSIS — R55 Syncope and collapse: Secondary | ICD-10-CM | POA: Diagnosis not present

## 2022-06-18 ENCOUNTER — Other Ambulatory Visit: Payer: Self-pay | Admitting: Family Medicine

## 2022-06-18 ENCOUNTER — Ambulatory Visit
Admission: RE | Admit: 2022-06-18 | Discharge: 2022-06-18 | Disposition: A | Discharge status: Home / Self Care | Payer: 59 | Source: Ambulatory Visit | Attending: Family Medicine | Admitting: Family Medicine

## 2022-06-18 DIAGNOSIS — M25551 Pain in right hip: Secondary | ICD-10-CM

## 2022-06-30 NOTE — Therapy (Signed)
OUTPATIENT PHYSICAL THERAPY LOWER EXTREMITY EVALUATION   Patient Name: Morgan Wolf MRN: [id number] DOB:[birth year], 52 y.o., female Today's Date: 07/01/2022  END OF SESSION:  PT End of Session - 07/01/22 1019     Visit Number 1    Date for PT Re-Evaluation 08/26/22    Authorization Type [facility] AETNA    PT Start Time 1019    PT Stop Time 1059    PT Time Calculation (min) 40 min    Activity Tolerance Patient tolerated treatment well    Behavior During Therapy [facility] for tasks assessed/performed             Past Medical History:  Diagnosis Date   Allergy    Amenorrhea    Dysmenorrhea    Endometriosis    Hormone disorder    Prolactinoma (HCC)    Followed by Dr Talmage Nap   Stroke [facility]) 12/2009   Past Surgical History:  Procedure Laterality Date   ABDOMINAL HYSTERECTOMY     CESAREAN SECTION     COLPOSCOPY     CYSTOSCOPY N/A 08/10/2017   Procedure: CYSTOSCOPY;  Surgeon: Romualdo Bolk, MD;  Location: [facility];  Service: Gynecology;  Laterality: N/A;   ENDOMETRIAL ABLATION     KNEE ARTHROSCOPY W/ ACL RECONSTRUCTION  11/2010   PELVIC LAPAROSCOPY     Endometriosis   TONSILLECTOMY AND ADENOIDECTOMY  2011   TOTAL LAPAROSCOPIC HYSTERECTOMY WITH SALPINGECTOMY Bilateral 08/10/2017   Procedure: TOTAL LAPAROSCOPIC HYSTERECTOMY WITH SALPINGECTOMY;  Surgeon: Romualdo Bolk, MD;  Location: [facility];  Service: Gynecology;  Laterality: Bilateral;  extended recovery bed   Patient Active Problem List   Diagnosis Date Noted   Hyperprolactinemia (HCC) 01/10/2021   Menopause 01/10/2021   Status post laparoscopic hysterectomy 08/10/2017   Pituitary tumor 07/08/2017   History of CVA (cerebrovascular accident) 07/08/2017    PCP: System, Provider Not In Orland Mustard MD    REFERRING PROVIDER: Lavada Mesi, MD   REFERRING DIAG: left adductor and thigh pain, R gluteal pain, tight HS and heel cords  THERAPY DIAG:  Left groin pain  Pain in  right hip  Abnormal posture  Cramp and spasm  Rationale for Evaluation and Treatment: Rehabilitation  ONSET DATE: a few months ago  SUBJECTIVE:   SUBJECTIVE STATEMENT: Patient reports she started having Left groin pain a few months ago. She feels it when she moves certain ways or walks. Did [place] and didn't see anything. Steroids have helped. Her right hip intermittently flares up since R tib/fib fx in 2017. Usually sees chiropractor for that. Denies back pain.  PERTINENT HISTORY: L plantar faciitis, CVA  2011,  L ACL reconstruction 2012, R tib/fib fx 2017  PAIN:  Are you having pain? Yes: NPRS scale: 0 today up to 5-6/10 Pain location: left groin Pain description: shooting band from groin to medial knee Aggravating factors: unsure  Relieving factors: steroids have helped stretching does NOT  Are you having pain? Yes: NPRS scale: 0 today up to 3-4/10 Pain location: R hip laterally - greater trochanter  Pain description: achy Aggravating factors: lying on it, walking Relieving factors: unsure  PRECAUTIONS: None  WEIGHT BEARING RESTRICTIONS: No  FALLS:  Has patient fallen in last 6 months? No  LIVING ENVIRONMENT: Lives with: lives with their spouse Lives in: House/apartment Stairs: Yes: Internal: 14 steps; on right going up Has following equipment at home: None  OCCUPATION: part time at eye doctor, up and down  PLOF: Independent  PATIENT GOALS: Inner thigh main  concern - get rid of pain, can't sit in butterfly position  NEXT MD VISIT: none scheduled   OBJECTIVE:   DIAGNOSTIC FINDINGS: [place] negative of groing  PATIENT SURVEYS:  FOTO 73 (81 predicted)  COGNITION: Overall cognitive status: Within functional limits for tasks assessed     SENSATION: WFL  MUSCLE LENGTH: Heelcords: bil tightness   POSTURE:  tight R QL with mild lateral shift ; elevated L IC in standing, even pelvic landmarks in supine  PALPATION: TTP: B gluteals, ITB, L ADDuctors and quads, R  piriformis, R lumbar Spinal mobility: decreased CPA and UPA mobs bil lumbar L1-L5 with pain  LOWER EXTREMITY ROM: WNL except L ankle DF 3 deg/6 deg A/PROM   LOWER EXTREMITY MMT: R hip flex and ADD 4+/5, L knee flex 4-/5 else 5/5    TODAY'S TREATMENT:                                                                                                                              DATE:   07/01/22 See pt ed and HEP  PATIENT EDUCATION:  Education details: PT eval findings, anticipated POC, initial HEP, and role of DN  Person educated: Patient Education method: Explanation, Demonstration, and Handouts Education comprehension: verbalized understanding and returned demonstration  HOME EXERCISE PROGRAM: Access Code: [email or web address] URL: [email or web address] Date: 07/01/2022 Prepared by: Raynelle Fanning  Exercises - Cat Cow  - 1 x daily - 7 x weekly - 3 sets - 10 reps - Child's Pose Stretch  - 1 x daily - 7 x weekly - 1 sets - 3 reps - 20-30 sec hold - Child's Pose with Sidebending  - 2 x daily - 7 x weekly - 1 sets - 2 reps - 60 sec hold - Lateral Shift Correction at Wall (Mirrored)  - 3 x daily - 7 x weekly - 1 sets - 10 reps - 10 sec  hold - Seated Piriformis Stretch with Trunk Bend  - 2 x daily - 7 x weekly - 1 sets - 3 reps - 30-60 sec  hold  Patient Education - Trigger Point Dry Needling  ASSESSMENT:  CLINICAL IMPRESSION: Patient is a 52 y.o. female who was seen today for physical therapy evaluation and treatment for left groin and right hip pain as well as tightness in her heel cords. Upon examination, pt demonstrates an elevated L Iliac crest but even pelvic landmarks in supine. She has tightness in her R QL and tightness, stiffness and pain in bil lumbar with CPA and UPA mobs. She demonstrates flexibility deficits in B LE and weakness in B LE and core with MMT of hip flexors. Deficits are affecting her ability to sleep on her R side, walk and perform ADLS. She will benefit from  skilled PT to address these deficits.    OBJECTIVE IMPAIRMENTS: decreased activity tolerance, decreased ROM, decreased strength, hypomobility, increased muscle spasms, impaired flexibility, postural dysfunction, and pain.   ACTIVITY LIMITATIONS: sleeping and locomotion level  PARTICIPATION LIMITATIONS:  intermittent pain with ADLS  PERSONAL FACTORS: 1-2 comorbidities: L plantar faciitis, , R tib/fib fx 2017  are also affecting patient's functional outcome.   REHAB POTENTIAL: Excellent  CLINICAL DECISION MAKING: Evolving/moderate complexity  EVALUATION COMPLEXITY: Low   GOALS: Goals reviewed with patient? Yes  SHORT TERM GOALS: Target date: 07/15/22 Ind with iniital HEP Baseline: Goal status: INITIAL  2.  Decreased pain in left groin and right hip by 25% Baseline:  Goal status: INITIAL  3.  Pt to demo normalized spinal posture in standing.  Baseline:  Goal status: INITIAL     LONG TERM GOALS: Target date: 08/26/22  Ind with advanced HEP  Baseline:  Goal status: INITIAL  2.  Decreased pain in bil hips by 85% with ADLs to improve QOL. Baseline:  Goal status: INITIAL  3.  Pt to demonstrate normal spinal mobility in the lumbar spine. Baseline:  Goal status: INITIAL  4.  Improved L ankle active DF to >= 5 deg Baseline:  Goal status: INITIAL  5.  Patient able to sleep on R side without pain Baseline:  Goal status: INITIAL  6.  Pt educated in ADL modifications and body mechanics to protect from further injury Baseline:  Goal status: INITIAL   PLAN:  PT FREQUENCY: 1x/week  PT DURATION: 8 weeks  PLANNED INTERVENTIONS: Therapeutic exercises, Therapeutic activity, Neuromuscular re-education, Balance training, Gait training, Patient/Family education, Self Care, Joint mobilization, Dry Needling, Electrical stimulation, Spinal mobilization, Cryotherapy, Moist heat, Taping, Traction, Ionotophoresis 4mg /ml Dexamethasone, and Manual therapy  PLAN FOR NEXT SESSION:  DN to L hip ADDuctors, quads, R hip gluteals, piriformis; possible lumbar to increase mobility. Work on lumbar spine mobility, core strength, L ankle flexibility. ADL/body mechanics. Groin pain may be coming from low back.    Nykiah Ma,Ambika, PT 07/01/2022, 2:56 PM

## 2022-07-01 ENCOUNTER — Ambulatory Visit: Payer: 59 | Attending: Family Medicine | Admitting: Physical Therapy

## 2022-07-01 ENCOUNTER — Other Ambulatory Visit: Payer: Self-pay

## 2022-07-01 ENCOUNTER — Encounter: Payer: Self-pay | Admitting: Physical Therapy

## 2022-07-01 DIAGNOSIS — M25551 Pain in right hip: Secondary | ICD-10-CM | POA: Diagnosis not present

## 2022-07-01 DIAGNOSIS — R252 Cramp and spasm: Secondary | ICD-10-CM | POA: Diagnosis not present

## 2022-07-01 DIAGNOSIS — R1032 Left lower quadrant pain: Secondary | ICD-10-CM

## 2022-07-01 DIAGNOSIS — R293 Abnormal posture: Secondary | ICD-10-CM | POA: Insufficient documentation

## 2022-07-10 NOTE — Therapy (Signed)
OUTPATIENT PHYSICAL THERAPY LOWER EXTREMITY EVALUATION   Patient Name: Morgan Wolf MRN: [id number] DOB:03/09/1970, 52 y.o., female Today's Date: 07/11/2022  END OF SESSION:  PT End of Session - 07/11/22 1013     Visit Number 2    Date for PT Re-Evaluation 08/26/22    Authorization Type [facility] AETNA    PT Start Time 0930    PT Stop Time 1010    PT Time Calculation (min) 40 min    Activity Tolerance Patient tolerated treatment well    Behavior During Therapy [facility] for tasks assessed/performed              Past Medical History:  Diagnosis Date   Allergy    Amenorrhea    Dysmenorrhea    Endometriosis    Hormone disorder    Prolactinoma (HCC)    Followed by Dr Talmage Nap   Stroke [facility]) 12/2009   Past Surgical History:  Procedure Laterality Date   ABDOMINAL HYSTERECTOMY     CESAREAN SECTION     COLPOSCOPY     CYSTOSCOPY N/A 08/10/2017   Procedure: CYSTOSCOPY;  Surgeon: Romualdo Bolk, MD;  Location: [facility];  Service: Gynecology;  Laterality: N/A;   ENDOMETRIAL ABLATION     KNEE ARTHROSCOPY W/ ACL RECONSTRUCTION  11/2010   PELVIC LAPAROSCOPY     Endometriosis   TONSILLECTOMY AND ADENOIDECTOMY  2011   TOTAL LAPAROSCOPIC HYSTERECTOMY WITH SALPINGECTOMY Bilateral 08/10/2017   Procedure: TOTAL LAPAROSCOPIC HYSTERECTOMY WITH SALPINGECTOMY;  Surgeon: Romualdo Bolk, MD;  Location: [facility];  Service: Gynecology;  Laterality: Bilateral;  extended recovery bed   Patient Active Problem List   Diagnosis Date Noted   Hyperprolactinemia (HCC) 01/10/2021   Menopause 01/10/2021   Status post laparoscopic hysterectomy 08/10/2017   Pituitary tumor 07/08/2017   History of CVA (cerebrovascular accident) 07/08/2017    PCP: System, Provider Not In Orland Mustard MD    REFERRING PROVIDER: Lavada Mesi, MD   REFERRING DIAG: left adductor and thigh pain, R gluteal pain, tight HS and heel cords  THERAPY DIAG:  Left groin  pain  Abnormal posture  Pain in right hip  Cramp and spasm  Rationale for Evaluation and Treatment: Rehabilitation  ONSET DATE: a few months ago  SUBJECTIVE:   SUBJECTIVE STATEMENT: Pt states that she notes some improvements since her initial eval.   PERTINENT HISTORY: L plantar faciitis, CVA  2011,  L ACL reconstruction 2012, R tib/fib fx 2017  PAIN:  Are you having pain? Yes: NPRS scale: 0 today up to 5-6/10 Pain location: left groin Pain description: shooting band from groin to medial knee Aggravating factors: unsure  Relieving factors: steroids have helped stretching does NOT  Are you having pain? Yes: NPRS scale: 0 today up to 3-4/10 Pain location: R hip laterally - greater trochanter  Pain description: achy Aggravating factors: lying on it, walking Relieving factors: unsure  PRECAUTIONS: None  WEIGHT BEARING RESTRICTIONS: No  FALLS:  Has patient fallen in last 6 months? No  LIVING ENVIRONMENT: Lives with: lives with their spouse Lives in: House/apartment Stairs: Yes: Internal: 14 steps; on right going up Has following equipment at home: None  OCCUPATION: part time at eye doctor, up and down  PLOF: Independent  PATIENT GOALS: Inner thigh main concern - get rid of pain, can't sit in butterfly position  NEXT MD VISIT: none scheduled   OBJECTIVE:   DIAGNOSTIC FINDINGS: [place] negative of groing  PATIENT SURVEYS:  FOTO 73 (81 predicted)  COGNITION:  Overall cognitive status: Within functional limits for tasks assessed     SENSATION: WFL  MUSCLE LENGTH: Heelcords: bil tightness   POSTURE:  tight R QL with mild lateral shift ; elevated L IC in standing, even pelvic landmarks in supine  PALPATION: TTP: B gluteals, ITB, L ADDuctors and quads, R piriformis, R lumbar Spinal mobility: decreased CPA and UPA mobs bil lumbar L1-L5 with pain  LOWER EXTREMITY ROM: WNL except L ankle DF 3 deg/6 deg A/PROM   LOWER EXTREMITY MMT: R hip flex and ADD 4+/5, L  knee flex 4-/5 else 5/5    TODAY'S TREATMENT:                                                                                                                              DATE:   07/11/2022: NuStep lvl 5, 5 min Trigger Point Dry-Needling  Treatment instructions: Expect mild to moderate muscle soreness. S/S of pneumothorax if dry needled over a lung field, and to seek immediate medical attention should they occur. Patient verbalized understanding of these instructions and education.  Patient Consent Given: Yes Education handout provided: No Muscles treated: L quad, L piriformis, L glute med Electrical stimulation performed: No Parameters: N/A Treatment response/outcome: Achy pain, but no residual effects.  STM to L lumbar paraspinals.  Child's pose 2x30 sec  Child's pose with side stretching 2x30 sec Cat/ cow x20  Supine figure 4 stretch 2x30 sec LTR x20   07/01/22 See pt ed and HEP  PATIENT EDUCATION:  Education details: PT eval findings, anticipated POC, initial HEP, and role of DN  Person educated: Patient Education method: Explanation, Demonstration, and Handouts Education comprehension: verbalized understanding and returned demonstration  HOME EXERCISE PROGRAM: Access Code: [email or web address] URL: [email or web address] Date: 07/01/2022 Prepared by: Raynelle Fanning  Exercises - Cat Cow  - 1 x daily - 7 x weekly - 3 sets - 10 reps - Child's Pose Stretch  - 1 x daily - 7 x weekly - 1 sets - 3 reps - 20-30 sec hold - Child's Pose with Sidebending  - 2 x daily - 7 x weekly - 1 sets - 2 reps - 60 sec hold - Lateral Shift Correction at Wall (Mirrored)  - 3 x daily - 7 x weekly - 1 sets - 10 reps - 10 sec  hold - Seated Piriformis Stretch with Trunk Bend  - 2 x daily - 7 x weekly - 1 sets - 3 reps - 30-60 sec  hold  Patient Education - Trigger Point Dry Needling  ASSESSMENT:  CLINICAL IMPRESSION: Pt presents to f/u appt with slight improvement in pain. She continues to note  most pain with sudden movements into hip abduction with a pulling sensation in her anterior/ medial thigh. Session with focus on lumbar and hip mobility along with TPDN. Pt responded well to session today with no adverse effects. She has a lot of tension noted in her glute med and L hip flexor and  would continue to benefit from [facility] and stretches. Educated pt about importance of stretching and plan to incorporate core and pelvic stability exercises to reduce familiar pain. Pt will continue to benefit from skilled PT to address continued deficits.     OBJECTIVE IMPAIRMENTS: decreased activity tolerance, decreased ROM, decreased strength, hypomobility, increased muscle spasms, impaired flexibility, postural dysfunction, and pain.   ACTIVITY LIMITATIONS: sleeping and locomotion level  PARTICIPATION LIMITATIONS:  intermittent pain with ADLS  PERSONAL FACTORS: 1-2 comorbidities: L plantar faciitis, , R tib/fib fx 2017  are also affecting patient's functional outcome.   REHAB POTENTIAL: Excellent  CLINICAL DECISION MAKING: Evolving/moderate complexity  EVALUATION COMPLEXITY: Low   GOALS: Goals reviewed with patient? Yes  SHORT TERM GOALS: Target date: 07/15/22 Ind with iniital HEP Baseline: Goal status: INITIAL  2.  Decreased pain in left groin and right hip by 25% Baseline:  Goal status: INITIAL  3.  Pt to demo normalized spinal posture in standing.  Baseline:  Goal status: INITIAL     LONG TERM GOALS: Target date: 08/26/22  Ind with advanced HEP  Baseline:  Goal status: INITIAL  2.  Decreased pain in bil hips by 85% with ADLs to improve QOL. Baseline:  Goal status: INITIAL  3.  Pt to demonstrate normal spinal mobility in the lumbar spine. Baseline:  Goal status: INITIAL  4.  Improved L ankle active DF to >= 5 deg Baseline:  Goal status: INITIAL  5.  Patient able to sleep on R side without pain Baseline:  Goal status: INITIAL  6.  Pt educated in ADL modifications  and body mechanics to protect from further injury Baseline:  Goal status: INITIAL   PLAN:  PT FREQUENCY: 1x/week  PT DURATION: 8 weeks  PLANNED INTERVENTIONS: Therapeutic exercises, Therapeutic activity, Neuromuscular re-education, Balance training, Gait training, Patient/Family education, Self Care, Joint mobilization, Dry Needling, Electrical stimulation, Spinal mobilization, Cryotherapy, Moist heat, Taping, Traction, Ionotophoresis 4mg /ml Dexamethasone, and Manual therapy  PLAN FOR NEXT SESSION: DN to L hip ADDuctors, quads, R hip gluteals, piriformis; possible lumbar to increase mobility. Work on lumbar spine mobility, core strength, L ankle flexibility. ADL/body mechanics. Groin pain may be coming from low back.    Champ Mungo, PT 07/11/2022, 10:13 AM

## 2022-07-11 ENCOUNTER — Encounter: Payer: Self-pay | Admitting: Physical Therapy

## 2022-07-11 ENCOUNTER — Ambulatory Visit: Payer: 59 | Attending: Family Medicine | Admitting: Physical Therapy

## 2022-07-11 DIAGNOSIS — R293 Abnormal posture: Secondary | ICD-10-CM

## 2022-07-11 DIAGNOSIS — M25551 Pain in right hip: Secondary | ICD-10-CM | POA: Diagnosis not present

## 2022-07-11 DIAGNOSIS — R252 Cramp and spasm: Secondary | ICD-10-CM | POA: Diagnosis not present

## 2022-07-11 DIAGNOSIS — R1032 Left lower quadrant pain: Secondary | ICD-10-CM | POA: Diagnosis not present

## 2022-07-18 ENCOUNTER — Ambulatory Visit: Payer: 59 | Admitting: Rehabilitative and Restorative Service Providers"

## 2022-07-18 ENCOUNTER — Encounter: Payer: Self-pay | Admitting: Rehabilitative and Restorative Service Providers"

## 2022-07-18 DIAGNOSIS — R293 Abnormal posture: Secondary | ICD-10-CM | POA: Diagnosis not present

## 2022-07-18 DIAGNOSIS — R1032 Left lower quadrant pain: Secondary | ICD-10-CM

## 2022-07-18 DIAGNOSIS — R252 Cramp and spasm: Secondary | ICD-10-CM | POA: Diagnosis not present

## 2022-07-18 DIAGNOSIS — M25551 Pain in right hip: Secondary | ICD-10-CM

## 2022-07-18 NOTE — Therapy (Signed)
OUTPATIENT PHYSICAL THERAPY TREATMENT NOTE AND DISCHARGE SUMMARY   Patient Name: Morgan Wolf MRN: [id number] DOB:04-06-1970, 52 y.o., female Today's Date: 07/18/2022  END OF SESSION:  PT End of Session - 07/18/22 1104     Visit Number 3    Date for PT Re-Evaluation 08/26/22    Authorization Type [facility] AETNA    PT Start Time 1100    PT Stop Time 1140    PT Time Calculation (min) 40 min    Activity Tolerance Patient tolerated treatment well    Behavior During Therapy [facility] for tasks assessed/performed              Past Medical History:  Diagnosis Date   Allergy    Amenorrhea    Dysmenorrhea    Endometriosis    Hormone disorder    Prolactinoma (HCC)    Followed by Dr Talmage Nap   Stroke [facility]) 12/2009   Past Surgical History:  Procedure Laterality Date   ABDOMINAL HYSTERECTOMY     CESAREAN SECTION     COLPOSCOPY     CYSTOSCOPY N/A 08/10/2017   Procedure: CYSTOSCOPY;  Surgeon: Romualdo Bolk, MD;  Location: [facility];  Service: Gynecology;  Laterality: N/A;   ENDOMETRIAL ABLATION     KNEE ARTHROSCOPY W/ ACL RECONSTRUCTION  11/2010   PELVIC LAPAROSCOPY     Endometriosis   TONSILLECTOMY AND ADENOIDECTOMY  2011   TOTAL LAPAROSCOPIC HYSTERECTOMY WITH SALPINGECTOMY Bilateral 08/10/2017   Procedure: TOTAL LAPAROSCOPIC HYSTERECTOMY WITH SALPINGECTOMY;  Surgeon: Romualdo Bolk, MD;  Location: [facility];  Service: Gynecology;  Laterality: Bilateral;  extended recovery bed   Patient Active Problem List   Diagnosis Date Noted   Hyperprolactinemia (HCC) 01/10/2021   Menopause 01/10/2021   Status post laparoscopic hysterectomy 08/10/2017   Pituitary tumor 07/08/2017   History of CVA (cerebrovascular accident) 07/08/2017    PCP:  Orland Mustard MD    REFERRING PROVIDER: Lavada Mesi, MD   REFERRING DIAG: left adductor and thigh pain, R gluteal pain, tight HS and heel cords  THERAPY DIAG:  Left groin pain  Abnormal  posture  Pain in right hip  Cramp and spasm  Rationale for Evaluation and Treatment: Rehabilitation  ONSET DATE: a few months ago  SUBJECTIVE:   SUBJECTIVE STATEMENT: Pt reports that she had some initial soreness for a day after dry needling and has not had any significant pain since that time.  Denies pain at this point.  PERTINENT HISTORY: L plantar faciitis, CVA  2011,  L ACL reconstruction 2012, R tib/fib fx 2017  PAIN:  Are you having pain? Yes: NPRS scale: 0/10 Pain location: left groin Pain description: shooting band from groin to medial knee Aggravating factors: unsure  Relieving factors: steroids have helped stretching does NOT    PRECAUTIONS: None  WEIGHT BEARING RESTRICTIONS: No  FALLS:  Has patient fallen in last 6 months? No  LIVING ENVIRONMENT: Lives with: lives with their spouse Lives in: House/apartment Stairs: Yes: Internal: 14 steps; on right going up Has following equipment at home: None  OCCUPATION: part time at [employer], up and down  PLOF: Independent  PATIENT GOALS: Inner thigh main concern - get rid of pain, can't sit in butterfly position  NEXT MD VISIT: none scheduled   OBJECTIVE:   DIAGNOSTIC FINDINGS: [place] negative of groing  PATIENT SURVEYS:  Eval:  FOTO 73 (81 predicted) 07/18/2022:  FOTO 99%  COGNITION: Overall cognitive status: Within functional limits for tasks assessed  SENSATION: WFL  MUSCLE LENGTH: Heelcords: bil tightness   POSTURE:  tight R QL with mild lateral shift ; elevated L IC in standing, even pelvic landmarks in supine  PALPATION: TTP: B gluteals, ITB, L ADDuctors and quads, R piriformis, R lumbar Spinal mobility: decreased CPA and UPA mobs bil lumbar L1-L5 with pain  LOWER EXTREMITY ROM:  Eval:  WNL except L ankle DF 3 deg/6 deg A/PROM 07/18/2022:  WNL throughout   LOWER EXTREMITY MMT:  Eval:  R hip flex and ADD 4+/5, L knee flex 4-/5 else 5/5 07/18/2022:  WNL throughout    TODAY'S  TREATMENT:                                                                                                                               DATE:  07/18/2022 Nustep level 5 x6 min with PT present to discuss status FOTO 99% Seated piriformis stretch 2x20 sec bilat Seated cat/cow x16 Standing "L" counter stretch 2x20 sec Standing side-bend with overhead reach x10 bilat Half kneeling QL stretch with overhead reach 2x20 sec bilat Quadruped bird dog x10 Quadruped fire hydrant 2x10 Lunge walking 2x10 ft Monster walking with green tband 2x5 ft Side stepping with green tband 2x10 ft bilat   DATE:  07/11/2022: NuStep lvl 5, 5 min Trigger Point Dry-Needling  Treatment instructions: Expect mild to moderate muscle soreness. S/S of pneumothorax if dry needled over a lung field, and to seek immediate medical attention should they occur. Patient verbalized understanding of these instructions and education.  Patient Consent Given: Yes Education handout provided: No Muscles treated: L quad, L piriformis, L glute med Electrical stimulation performed: No Parameters: N/A Treatment response/outcome: Achy pain, but no residual effects.  STM to L lumbar paraspinals.  Child's pose 2x30 sec  Child's pose with side stretching 2x30 sec Cat/ cow x20  Supine figure 4 stretch 2x30 sec LTR x20     PATIENT EDUCATION:  Education details: PT eval findings, anticipated POC, initial HEP, and role of DN  Person educated: Patient Education method: Explanation, Demonstration, and Handouts Education comprehension: verbalized understanding and returned demonstration  HOME EXERCISE PROGRAM: Access Code: [email or web address] URL: [email or web address] Date: 07/18/2022 Prepared by: Clydie Braun Janeka Libman  Exercises - Cat Cow  - 1 x daily - 7 x weekly - 3 sets - 10 reps - Child's Pose Stretch  - 1 x daily - 7 x weekly - 1 sets - 3 reps - 20-30 sec hold - Child's Pose with Sidebending  - 2 x daily - 7 x weekly - 1 sets  - 2 reps - 60 sec hold - Hip External Rotation Stretch  - 1 x daily - 7 x weekly - 1 sets - 2 reps - 20 sec hold - Bird Dog  - 1 x daily - 7 x weekly - 2 sets - 10 reps - Quadruped Hip Abduction and External Rotation  - 1 x daily - 7 x weekly - 2 sets -  10 reps - Butterfly Groin Stretch  - 1 x daily - 7 x weekly - 1 sets - 2 reps - 20 sec hold - Half Kneeling Hip Flexor Stretch with Sidebend  - 1 x daily - 7 x weekly - 1 sets - 2 reps - 20 sec hold - Lateral Shift Correction at Wall (Mirrored)  - 3 x daily - 7 x weekly - 1 sets - 10 reps - 10 sec  hold - Standing 'L' Stretch at Counter  - 1 x daily - 7 x weekly - 1 sets - 2 reps - 20 sec hold - Seated Piriformis Stretch with Trunk Bend  - 2 x daily - 7 x weekly - 1 sets - 3 reps - 30-60 sec  hold  ASSESSMENT:  CLINICAL IMPRESSION: Ms Juelfs presents to skilled PT reporting at least 90% improvements since initial evaluation with reporting that pain is no longer waking her up at night during sleep.  She reports that she feels that she can continue to perform HEP independently now at home, as the dry needling helped significantly.  Patient able to progress through higher level stretches and strengthening without any difficulty or increased pain.  Patient has met all goals and demonstrates improved posture and body mechanics during session and reports compliance with HEP and stretching.  Provided patient with new handout of exercises.  Patient is ready for discharge from skilled PT to continue with HEP.    OBJECTIVE IMPAIRMENTS: decreased activity tolerance, decreased ROM, decreased strength, hypomobility, increased muscle spasms, impaired flexibility, postural dysfunction, and pain.   ACTIVITY LIMITATIONS: sleeping and locomotion level  PARTICIPATION LIMITATIONS:  intermittent pain with ADLS  PERSONAL FACTORS: 1-2 comorbidities: L plantar faciitis, , R tib/fib fx 2017  are also affecting patient's functional outcome.   REHAB POTENTIAL:  Excellent  CLINICAL DECISION MAKING: Evolving/moderate complexity  EVALUATION COMPLEXITY: Low   GOALS: Goals reviewed with patient? Yes  SHORT TERM GOALS: Target date: 07/15/22 Ind with iniital HEP Baseline: Goal status: MET  2.  Decreased pain in left groin and right hip by 25% Baseline:  Goal status: MET  3.  Pt to demo normalized spinal posture in standing.  Baseline:  Goal status: MET     LONG TERM GOALS: Target date: 08/26/22  Ind with advanced HEP  Baseline:  Goal status: MET  2.  Decreased pain in bil hips by 85% with ADLs to improve QOL. Baseline:  Goal status: MET  3.  Pt to demonstrate normal spinal mobility in the lumbar spine. Baseline:  Goal status: MET  4.  Improved L ankle active DF to >= 5 deg Baseline:  Goal status: MET  5.  Patient able to sleep on R side without pain Baseline:  Goal status: MET  6.  Pt educated in ADL modifications and body mechanics to protect from further injury Baseline:  Goal status: MET   PLAN:  PT FREQUENCY: 1x/week  PT DURATION: 8 weeks  PLANNED INTERVENTIONS: Therapeutic exercises, Therapeutic activity, Neuromuscular re-education, Balance training, Gait training, Patient/Family education, Self Care, Joint mobilization, Dry Needling, Electrical stimulation, Spinal mobilization, Cryotherapy, Moist heat, Taping, Traction, Ionotophoresis 4mg /ml Dexamethasone, and Manual therapy    PHYSICAL THERAPY DISCHARGE SUMMARY  Patient agrees to discharge. Patient goals were met. Patient is being discharged due to meeting the stated rehab goals.     Reather Laurence, PT 07/18/2022, 11:48 AM  [facility] [address] [place], [place] [address] Phone # [phone] Fax [phone]

## 2022-07-25 ENCOUNTER — Ambulatory Visit: Payer: 59 | Admitting: Rehabilitative and Restorative Service Providers"

## 2022-12-16 ENCOUNTER — Other Ambulatory Visit: Payer: Self-pay | Admitting: Neurosurgery

## 2022-12-16 DIAGNOSIS — E236 Other disorders of pituitary gland: Secondary | ICD-10-CM

## 2022-12-26 DIAGNOSIS — M722 Plantar fascial fibromatosis: Secondary | ICD-10-CM | POA: Diagnosis not present

## 2022-12-26 DIAGNOSIS — M7061 Trochanteric bursitis, right hip: Secondary | ICD-10-CM | POA: Diagnosis not present

## 2023-01-05 DIAGNOSIS — M722 Plantar fascial fibromatosis: Secondary | ICD-10-CM | POA: Diagnosis not present

## 2023-01-05 DIAGNOSIS — M7061 Trochanteric bursitis, right hip: Secondary | ICD-10-CM | POA: Diagnosis not present

## 2023-01-13 DIAGNOSIS — M722 Plantar fascial fibromatosis: Secondary | ICD-10-CM | POA: Diagnosis not present

## 2023-01-13 DIAGNOSIS — M7061 Trochanteric bursitis, right hip: Secondary | ICD-10-CM | POA: Diagnosis not present

## 2023-01-19 ENCOUNTER — Ambulatory Visit
Admission: RE | Admit: 2023-01-19 | Discharge: 2023-01-19 | Disposition: A | Discharge status: Home / Self Care | Payer: 59 | Source: Ambulatory Visit | Attending: Neurosurgery

## 2023-01-19 DIAGNOSIS — G9389 Other specified disorders of brain: Secondary | ICD-10-CM | POA: Diagnosis not present

## 2023-01-19 DIAGNOSIS — E236 Other disorders of pituitary gland: Secondary | ICD-10-CM

## 2023-01-19 MED ORDER — GADOPICLENOL 0.5 MMOL/ML IV SOLN
7.0000 mL | Freq: Once | INTRAVENOUS | Status: AC | PRN
  Administered 2023-01-19: 7 mL via INTRAVENOUS

## 2023-01-20 DIAGNOSIS — M722 Plantar fascial fibromatosis: Secondary | ICD-10-CM | POA: Diagnosis not present

## 2023-01-20 DIAGNOSIS — M7061 Trochanteric bursitis, right hip: Secondary | ICD-10-CM | POA: Diagnosis not present

## 2023-02-06 DIAGNOSIS — H5213 Myopia, bilateral: Secondary | ICD-10-CM | POA: Diagnosis not present

## 2023-02-06 DIAGNOSIS — D352 Benign neoplasm of pituitary gland: Secondary | ICD-10-CM | POA: Diagnosis not present

## 2023-04-28 ENCOUNTER — Other Ambulatory Visit: Payer: Self-pay

## 2023-04-28 ENCOUNTER — Other Ambulatory Visit (HOSPITAL_COMMUNITY): Payer: Self-pay

## 2023-04-28 MED ORDER — MELOXICAM 15 MG PO TABS
15.0000 mg | ORAL_TABLET | Freq: Every day | ORAL | 1 refills | Status: AC
  Filled 2023-04-28 – 2023-05-28 (×2): qty 30, 30d supply, fill #0

## 2023-04-29 ENCOUNTER — Other Ambulatory Visit: Payer: Self-pay

## 2023-05-01 ENCOUNTER — Other Ambulatory Visit (HOSPITAL_COMMUNITY): Payer: Self-pay

## 2023-05-04 ENCOUNTER — Other Ambulatory Visit: Payer: Self-pay

## 2023-05-28 ENCOUNTER — Other Ambulatory Visit: Payer: Self-pay

## 2023-05-28 ENCOUNTER — Other Ambulatory Visit (HOSPITAL_COMMUNITY): Payer: Self-pay

## 2023-10-29 ENCOUNTER — Other Ambulatory Visit (HOSPITAL_COMMUNITY): Payer: Self-pay

## 2023-10-29 ENCOUNTER — Other Ambulatory Visit: Payer: Self-pay

## 2023-10-29 MED ORDER — MELOXICAM 15 MG PO TABS
15.0000 mg | ORAL_TABLET | Freq: Every day | ORAL | 0 refills | Status: AC
  Filled 2023-10-29: qty 90, 90d supply, fill #0

## 2023-10-30 ENCOUNTER — Other Ambulatory Visit: Payer: Self-pay | Admitting: Family Medicine

## 2023-10-30 DIAGNOSIS — M25551 Pain in right hip: Secondary | ICD-10-CM

## 2023-10-30 DIAGNOSIS — M5416 Radiculopathy, lumbar region: Secondary | ICD-10-CM

## 2023-11-03 ENCOUNTER — Encounter: Payer: Self-pay | Admitting: Family Medicine

## 2023-11-06 ENCOUNTER — Inpatient Hospital Stay (HOSPITAL_BASED_OUTPATIENT_CLINIC_OR_DEPARTMENT_OTHER): Admission: RE | Admit: 2023-11-06 | Source: Ambulatory Visit | Admitting: Radiology

## 2023-11-06 DIAGNOSIS — Z1231 Encounter for screening mammogram for malignant neoplasm of breast: Secondary | ICD-10-CM

## 2023-11-09 ENCOUNTER — Ambulatory Visit
Admission: RE | Admit: 2023-11-09 | Discharge: 2023-11-09 | Disposition: A | Discharge status: Home / Self Care | Source: Ambulatory Visit | Attending: Family Medicine | Admitting: Family Medicine

## 2023-11-09 ENCOUNTER — Ambulatory Visit
Admission: RE | Admit: 2023-11-09 | Discharge: 2023-11-09 | Disposition: A | Discharge status: Home / Self Care | Source: Ambulatory Visit | Attending: Family Medicine

## 2023-11-09 DIAGNOSIS — M25551 Pain in right hip: Secondary | ICD-10-CM | POA: Diagnosis not present

## 2023-11-09 DIAGNOSIS — M5416 Radiculopathy, lumbar region: Secondary | ICD-10-CM

## 2023-11-09 DIAGNOSIS — M47817 Spondylosis without myelopathy or radiculopathy, lumbosacral region: Secondary | ICD-10-CM | POA: Diagnosis not present

## 2023-12-18 DIAGNOSIS — M79671 Pain in right foot: Secondary | ICD-10-CM | POA: Diagnosis not present

## 2023-12-18 DIAGNOSIS — M21621 Bunionette of right foot: Secondary | ICD-10-CM | POA: Diagnosis not present

## 2024-01-05 ENCOUNTER — Other Ambulatory Visit: Payer: Self-pay

## 2024-01-05 ENCOUNTER — Other Ambulatory Visit (HOSPITAL_COMMUNITY): Payer: Self-pay

## 2024-01-05 MED ORDER — ALBUTEROL SULFATE HFA 108 (90 BASE) MCG/ACT IN AERS
1.0000 | INHALATION_SPRAY | Freq: Four times a day (QID) | RESPIRATORY_TRACT | 2 refills | Status: AC | PRN
  Filled 2024-01-05: qty 6.7, 25d supply, fill #0

## 2024-02-09 DIAGNOSIS — D352 Benign neoplasm of pituitary gland: Secondary | ICD-10-CM | POA: Diagnosis not present

## 2024-02-10 DIAGNOSIS — H5213 Myopia, bilateral: Secondary | ICD-10-CM | POA: Diagnosis not present
